# Patient Record
Sex: Male | Born: 1950
Health system: Southern US, Community
[De-identification: ages and names within clinical notes are randomized; demographics above are authoritative.]

## PROBLEM LIST (undated history)

## (undated) DIAGNOSIS — I10 Essential (primary) hypertension: Secondary | ICD-10-CM

---

## 2009-05-16 ENCOUNTER — Ambulatory Visit (HOSPITAL_COMMUNITY): Admission: RE | Admit: 2009-05-16 | Discharge: 2009-05-16 | Payer: Self-pay | Admitting: Orthopedic Surgery

## 2014-02-04 DIAGNOSIS — Z23 Encounter for immunization: Secondary | ICD-10-CM | POA: Diagnosis not present

## 2014-07-11 DIAGNOSIS — I129 Hypertensive chronic kidney disease with stage 1 through stage 4 chronic kidney disease, or unspecified chronic kidney disease: Secondary | ICD-10-CM | POA: Diagnosis not present

## 2014-07-11 DIAGNOSIS — E782 Mixed hyperlipidemia: Secondary | ICD-10-CM | POA: Diagnosis not present

## 2014-07-11 DIAGNOSIS — N183 Chronic kidney disease, stage 3 (moderate): Secondary | ICD-10-CM | POA: Diagnosis not present

## 2014-07-11 DIAGNOSIS — R7301 Impaired fasting glucose: Secondary | ICD-10-CM | POA: Diagnosis not present

## 2014-11-13 DIAGNOSIS — M1990 Unspecified osteoarthritis, unspecified site: Secondary | ICD-10-CM | POA: Diagnosis not present

## 2014-11-13 DIAGNOSIS — I129 Hypertensive chronic kidney disease with stage 1 through stage 4 chronic kidney disease, or unspecified chronic kidney disease: Secondary | ICD-10-CM | POA: Diagnosis not present

## 2014-11-13 DIAGNOSIS — Z23 Encounter for immunization: Secondary | ICD-10-CM | POA: Diagnosis not present

## 2014-11-13 DIAGNOSIS — M109 Gout, unspecified: Secondary | ICD-10-CM | POA: Diagnosis not present

## 2014-11-13 DIAGNOSIS — N183 Chronic kidney disease, stage 3 (moderate): Secondary | ICD-10-CM | POA: Diagnosis not present

## 2014-11-13 DIAGNOSIS — N400 Enlarged prostate without lower urinary tract symptoms: Secondary | ICD-10-CM | POA: Diagnosis not present

## 2014-11-13 DIAGNOSIS — N529 Male erectile dysfunction, unspecified: Secondary | ICD-10-CM | POA: Diagnosis not present

## 2014-11-13 DIAGNOSIS — Z125 Encounter for screening for malignant neoplasm of prostate: Secondary | ICD-10-CM | POA: Diagnosis not present

## 2014-11-13 DIAGNOSIS — Z0000 Encounter for general adult medical examination without abnormal findings: Secondary | ICD-10-CM | POA: Diagnosis not present

## 2014-11-13 DIAGNOSIS — R7301 Impaired fasting glucose: Secondary | ICD-10-CM | POA: Diagnosis not present

## 2014-11-13 DIAGNOSIS — Z1211 Encounter for screening for malignant neoplasm of colon: Secondary | ICD-10-CM | POA: Diagnosis not present

## 2014-11-13 DIAGNOSIS — E782 Mixed hyperlipidemia: Secondary | ICD-10-CM | POA: Diagnosis not present

## 2015-06-13 DIAGNOSIS — R7301 Impaired fasting glucose: Secondary | ICD-10-CM | POA: Diagnosis not present

## 2015-06-13 DIAGNOSIS — E782 Mixed hyperlipidemia: Secondary | ICD-10-CM | POA: Diagnosis not present

## 2015-06-13 DIAGNOSIS — N183 Chronic kidney disease, stage 3 (moderate): Secondary | ICD-10-CM | POA: Diagnosis not present

## 2015-06-13 DIAGNOSIS — I129 Hypertensive chronic kidney disease with stage 1 through stage 4 chronic kidney disease, or unspecified chronic kidney disease: Secondary | ICD-10-CM | POA: Diagnosis not present

## 2016-01-20 DIAGNOSIS — Z1211 Encounter for screening for malignant neoplasm of colon: Secondary | ICD-10-CM | POA: Diagnosis not present

## 2016-01-20 DIAGNOSIS — Z0000 Encounter for general adult medical examination without abnormal findings: Secondary | ICD-10-CM | POA: Diagnosis not present

## 2016-01-20 DIAGNOSIS — R7301 Impaired fasting glucose: Secondary | ICD-10-CM | POA: Diagnosis not present

## 2016-01-20 DIAGNOSIS — E782 Mixed hyperlipidemia: Secondary | ICD-10-CM | POA: Diagnosis not present

## 2016-01-20 DIAGNOSIS — M1990 Unspecified osteoarthritis, unspecified site: Secondary | ICD-10-CM | POA: Diagnosis not present

## 2016-01-20 DIAGNOSIS — N529 Male erectile dysfunction, unspecified: Secondary | ICD-10-CM | POA: Diagnosis not present

## 2016-01-20 DIAGNOSIS — I129 Hypertensive chronic kidney disease with stage 1 through stage 4 chronic kidney disease, or unspecified chronic kidney disease: Secondary | ICD-10-CM | POA: Diagnosis not present

## 2016-01-20 DIAGNOSIS — Z125 Encounter for screening for malignant neoplasm of prostate: Secondary | ICD-10-CM | POA: Diagnosis not present

## 2016-01-20 DIAGNOSIS — M109 Gout, unspecified: Secondary | ICD-10-CM | POA: Diagnosis not present

## 2016-01-20 DIAGNOSIS — Z23 Encounter for immunization: Secondary | ICD-10-CM | POA: Diagnosis not present

## 2016-01-20 DIAGNOSIS — N400 Enlarged prostate without lower urinary tract symptoms: Secondary | ICD-10-CM | POA: Diagnosis not present

## 2016-01-20 DIAGNOSIS — N183 Chronic kidney disease, stage 3 (moderate): Secondary | ICD-10-CM | POA: Diagnosis not present

## 2016-07-21 DIAGNOSIS — N183 Chronic kidney disease, stage 3 (moderate): Secondary | ICD-10-CM | POA: Diagnosis not present

## 2016-07-21 DIAGNOSIS — I129 Hypertensive chronic kidney disease with stage 1 through stage 4 chronic kidney disease, or unspecified chronic kidney disease: Secondary | ICD-10-CM | POA: Diagnosis not present

## 2016-07-21 DIAGNOSIS — R7301 Impaired fasting glucose: Secondary | ICD-10-CM | POA: Diagnosis not present

## 2016-07-21 DIAGNOSIS — E782 Mixed hyperlipidemia: Secondary | ICD-10-CM | POA: Diagnosis not present

## 2016-07-21 DIAGNOSIS — M1990 Unspecified osteoarthritis, unspecified site: Secondary | ICD-10-CM | POA: Diagnosis not present

## 2017-01-04 ENCOUNTER — Inpatient Hospital Stay (HOSPITAL_COMMUNITY)
Admission: EM | Admit: 2017-01-04 | Discharge: 2017-01-07 | DRG: 183 | Disposition: A | Discharge status: Home / Self Care | Payer: Medicare Other | Attending: General Surgery | Admitting: General Surgery

## 2017-01-04 ENCOUNTER — Emergency Department (HOSPITAL_COMMUNITY): Payer: Medicare Other

## 2017-01-04 ENCOUNTER — Encounter (HOSPITAL_COMMUNITY): Payer: Self-pay | Admitting: Emergency Medicine

## 2017-01-04 DIAGNOSIS — I10 Essential (primary) hypertension: Secondary | ICD-10-CM | POA: Diagnosis present

## 2017-01-04 DIAGNOSIS — S271XXA Traumatic hemothorax, initial encounter: Secondary | ICD-10-CM | POA: Diagnosis present

## 2017-01-04 DIAGNOSIS — J942 Hemothorax: Secondary | ICD-10-CM

## 2017-01-04 DIAGNOSIS — S2242XA Multiple fractures of ribs, left side, initial encounter for closed fracture: Principal | ICD-10-CM | POA: Diagnosis present

## 2017-01-04 DIAGNOSIS — R9431 Abnormal electrocardiogram [ECG] [EKG]: Secondary | ICD-10-CM | POA: Diagnosis not present

## 2017-01-04 DIAGNOSIS — R079 Chest pain, unspecified: Secondary | ICD-10-CM | POA: Diagnosis not present

## 2017-01-04 DIAGNOSIS — W208XXA Other cause of strike by thrown, projected or falling object, initial encounter: Secondary | ICD-10-CM | POA: Diagnosis not present

## 2017-01-04 DIAGNOSIS — J939 Pneumothorax, unspecified: Secondary | ICD-10-CM | POA: Diagnosis not present

## 2017-01-04 DIAGNOSIS — N179 Acute kidney failure, unspecified: Secondary | ICD-10-CM | POA: Diagnosis present

## 2017-01-04 DIAGNOSIS — K7689 Other specified diseases of liver: Secondary | ICD-10-CM | POA: Diagnosis present

## 2017-01-04 DIAGNOSIS — S42112A Displaced fracture of body of scapula, left shoulder, initial encounter for closed fracture: Secondary | ICD-10-CM | POA: Diagnosis not present

## 2017-01-04 DIAGNOSIS — E041 Nontoxic single thyroid nodule: Secondary | ICD-10-CM | POA: Diagnosis present

## 2017-01-04 DIAGNOSIS — M549 Dorsalgia, unspecified: Secondary | ICD-10-CM | POA: Diagnosis not present

## 2017-01-04 DIAGNOSIS — S42102A Fracture of unspecified part of scapula, left shoulder, initial encounter for closed fracture: Secondary | ICD-10-CM | POA: Diagnosis present

## 2017-01-04 DIAGNOSIS — S27321A Contusion of lung, unilateral, initial encounter: Secondary | ICD-10-CM | POA: Diagnosis present

## 2017-01-04 DIAGNOSIS — J90 Pleural effusion, not elsewhere classified: Secondary | ICD-10-CM | POA: Diagnosis not present

## 2017-01-04 DIAGNOSIS — S0990XA Unspecified injury of head, initial encounter: Secondary | ICD-10-CM | POA: Diagnosis not present

## 2017-01-04 DIAGNOSIS — Z23 Encounter for immunization: Secondary | ICD-10-CM

## 2017-01-04 DIAGNOSIS — Y93H2 Activity, gardening and landscaping: Secondary | ICD-10-CM | POA: Diagnosis not present

## 2017-01-04 DIAGNOSIS — D62 Acute posthemorrhagic anemia: Secondary | ICD-10-CM | POA: Diagnosis present

## 2017-01-04 DIAGNOSIS — M25512 Pain in left shoulder: Secondary | ICD-10-CM | POA: Diagnosis not present

## 2017-01-04 DIAGNOSIS — S3991XA Unspecified injury of abdomen, initial encounter: Secondary | ICD-10-CM | POA: Diagnosis not present

## 2017-01-04 DIAGNOSIS — S199XXA Unspecified injury of neck, initial encounter: Secondary | ICD-10-CM | POA: Diagnosis not present

## 2017-01-04 DIAGNOSIS — T148XXA Other injury of unspecified body region, initial encounter: Secondary | ICD-10-CM | POA: Diagnosis not present

## 2017-01-04 HISTORY — DX: Essential (primary) hypertension: I10

## 2017-01-04 LAB — COMPREHENSIVE METABOLIC PANEL
ALK PHOS: 37 U/L — AB (ref 38–126)
ALT: 43 U/L (ref 17–63)
AST: 51 U/L — ABNORMAL HIGH (ref 15–41)
Albumin: 3.7 g/dL (ref 3.5–5.0)
Anion gap: 10 (ref 5–15)
BUN: 34 mg/dL — ABNORMAL HIGH (ref 6–20)
CALCIUM: 8.9 mg/dL (ref 8.9–10.3)
CO2: 23 mmol/L (ref 22–32)
CREATININE: 1.95 mg/dL — AB (ref 0.61–1.24)
Chloride: 105 mmol/L (ref 101–111)
GFR calc non Af Amer: 34 mL/min — ABNORMAL LOW (ref >60)
GFR, EST AFRICAN AMERICAN: 39 mL/min — AB (ref >60)
Glucose, Bld: 153 mg/dL — ABNORMAL HIGH (ref 65–99)
Potassium: 4 mmol/L (ref 3.5–5.1)
SODIUM: 138 mmol/L (ref 135–145)
Total Bilirubin: 0.7 mg/dL (ref 0.3–1.2)
Total Protein: 6.1 g/dL — ABNORMAL LOW (ref 6.5–8.1)

## 2017-01-04 LAB — CBC
HEMATOCRIT: 41.7 % (ref 39.0–52.0)
HEMOGLOBIN: 14 g/dL (ref 13.0–17.0)
MCH: 29.3 pg (ref 26.0–34.0)
MCHC: 33.6 g/dL (ref 30.0–36.0)
MCV: 87.2 fL (ref 78.0–100.0)
Platelets: 277 10*3/uL (ref 150–400)
RBC: 4.78 MIL/uL (ref 4.22–5.81)
RDW: 13.1 % (ref 11.5–15.5)
WBC: 15.3 10*3/uL — ABNORMAL HIGH (ref 4.0–10.5)

## 2017-01-04 LAB — URINALYSIS, ROUTINE W REFLEX MICROSCOPIC
BILIRUBIN URINE: NEGATIVE
Glucose, UA: NEGATIVE mg/dL
KETONES UR: NEGATIVE mg/dL
Leukocytes, UA: NEGATIVE
Nitrite: NEGATIVE
PROTEIN: 100 mg/dL — AB
SQUAMOUS EPITHELIAL / LPF: NONE SEEN
Specific Gravity, Urine: 1.04 — ABNORMAL HIGH (ref 1.005–1.030)
pH: 5 (ref 5.0–8.0)

## 2017-01-04 LAB — I-STAT CHEM 8, ED
BUN: 35 mg/dL — AB (ref 6–20)
CALCIUM ION: 1.13 mmol/L — AB (ref 1.15–1.40)
CHLORIDE: 104 mmol/L (ref 101–111)
Creatinine, Ser: 1.9 mg/dL — ABNORMAL HIGH (ref 0.61–1.24)
GLUCOSE: 158 mg/dL — AB (ref 65–99)
HCT: 41 % (ref 39.0–52.0)
Hemoglobin: 13.9 g/dL (ref 13.0–17.0)
Potassium: 4 mmol/L (ref 3.5–5.1)
Sodium: 140 mmol/L (ref 135–145)
TCO2: 23 mmol/L (ref 22–32)

## 2017-01-04 LAB — I-STAT CG4 LACTIC ACID, ED: LACTIC ACID, VENOUS: 2.59 mmol/L — AB (ref 0.5–1.9)

## 2017-01-04 LAB — ETHANOL: Alcohol, Ethyl (B): <10 mg/dL (ref <10)

## 2017-01-04 LAB — SAMPLE TO BLOOD BANK

## 2017-01-04 LAB — PROTIME-INR
INR: 1.01
Prothrombin Time: 13.2 seconds (ref 11.4–15.2)

## 2017-01-04 LAB — CDS SEROLOGY

## 2017-01-04 MED ORDER — MORPHINE SULFATE (PF) 4 MG/ML IV SOLN: 4.0000 mg | INTRAVENOUS | Status: DC | PRN

## 2017-01-04 MED ORDER — ACETAMINOPHEN 325 MG PO TABS: 650.0000 mg | ORAL_TABLET | ORAL | Status: DC | PRN

## 2017-01-04 MED ORDER — ALBUMIN HUMAN 5 % IV SOLN
12.5000 g | Freq: Once | INTRAVENOUS | Status: AC
  Administered 2017-01-04: 12.5 g via INTRAVENOUS
  Filled 2017-01-04: qty 250

## 2017-01-04 MED ORDER — FENTANYL CITRATE (PF) 100 MCG/2ML IJ SOLN
25.0000 ug | INTRAMUSCULAR | Status: DC | PRN
  Administered 2017-01-04 (×2): 25 ug via INTRAVENOUS
  Filled 2017-01-04 (×2): qty 2

## 2017-01-04 MED ORDER — KCL IN DEXTROSE-NACL 20-5-0.45 MEQ/L-%-% IV SOLN
INTRAVENOUS | Status: DC
  Administered 2017-01-04: 19:00:00 via INTRAVENOUS
  Filled 2017-01-04 (×3): qty 1000

## 2017-01-04 MED ORDER — OXYCODONE HCL 5 MG PO TABS
10.0000 mg | ORAL_TABLET | ORAL | Status: DC | PRN
  Administered 2017-01-04 – 2017-01-07 (×6): 10 mg via ORAL
  Filled 2017-01-04 (×7): qty 2

## 2017-01-04 MED ORDER — SODIUM CHLORIDE 0.9 % IV BOLUS (SEPSIS)
1000.0000 mL | Freq: Once | INTRAVENOUS | Status: AC
  Administered 2017-01-04: 1000 mL via INTRAVENOUS

## 2017-01-04 MED ORDER — METHOCARBAMOL 1000 MG/10ML IJ SOLN
1000.0000 mg | Freq: Three times a day (TID) | INTRAVENOUS | Status: DC | PRN
  Filled 2017-01-04: qty 10

## 2017-01-04 MED ORDER — IOPAMIDOL (ISOVUE-300) INJECTION 61%
INTRAVENOUS | Status: AC
  Administered 2017-01-04: 70 mL
  Filled 2017-01-04: qty 75

## 2017-01-04 MED ORDER — OXYCODONE HCL 5 MG PO TABS
5.0000 mg | ORAL_TABLET | ORAL | Status: DC | PRN
  Administered 2017-01-06 – 2017-01-07 (×3): 5 mg via ORAL
  Filled 2017-01-04 (×3): qty 1

## 2017-01-04 MED ORDER — IPRATROPIUM-ALBUTEROL 0.5-2.5 (3) MG/3ML IN SOLN
3.0000 mL | Freq: Four times a day (QID) | RESPIRATORY_TRACT | Status: DC
  Administered 2017-01-04 (×2): 3 mL via RESPIRATORY_TRACT
  Filled 2017-01-04 (×2): qty 3

## 2017-01-04 MED ORDER — TRAMADOL HCL 50 MG PO TABS
50.0000 mg | ORAL_TABLET | Freq: Four times a day (QID) | ORAL | Status: DC
  Administered 2017-01-04 – 2017-01-06 (×8): 50 mg via ORAL
  Filled 2017-01-04 (×8): qty 1

## 2017-01-04 MED ORDER — SODIUM CHLORIDE 0.9 % IV SOLN
Freq: Once | INTRAVENOUS | Status: AC
  Administered 2017-01-04: 14:00:00 via INTRAVENOUS

## 2017-01-04 NOTE — H&P (Signed)
[facility] Admission Note  Joshua Durham 1950-08-13  [id number].    Requesting MD: Vanita Panda, MD Chief Complaint/Reason for Consult: traumatic injury from tree limb  HPI:  Joshua Durham is a 66 y/o male with a PMH of HTN who presented to the ED via EMS as a non-trauma activation after a large tree limb fell on his back. The patient reports +LOC and is amnestic to the event but, according to a bystander, the patient was cutting down a very large, dead [place] when it fell and hit him on the left side of his back. The patient recalls the limb falling but does not remember where it hit him or moving from the scene of the accident. He was helped back into his house before EMS was called. He arrived to the ED with cc L upper back pain and SOB. He denies HA, CP, abdominal pain, or pain in extremities. He is a non-smoker. Denies history of diabetes or MI. Has no history of surgery. NKDA. Reports that his PCP is Dr. Serita Grammes. His wife reports that he may have had abnormal kidney function in the past.  ROS: Review of Systems  Constitutional: Negative for chills and fever.  Respiratory: Positive for shortness of breath. Negative for hemoptysis and sputum production.   Cardiovascular: Negative for chest pain.  Gastrointestinal: Negative for abdominal pain, nausea and vomiting.  Musculoskeletal: Positive for back pain.  Neurological: Positive for loss of consciousness.    No family history on file.  Past Medical History:  Diagnosis Date  . Hypertension     History reviewed. No pertinent surgical history.  Social History:  has no tobacco, alcohol, and drug history on file.  Allergies: No Known Allergies   (Not in a hospital admission)  Blood pressure (!) 94/57, pulse (!) 53, resp. rate 18, SpO2 97 %. Physical Exam: Physical Exam  Constitutional: He is oriented to person, place, and time. He appears well-developed and well-nourished. No distress.  HENT:  Head:  Normocephalic and atraumatic.  Right Ear: External ear normal.  Left Ear: External ear normal.  Eyes: Pupils are equal, round, and reactive to light. EOM are normal. Right eye exhibits no discharge. Left eye exhibits no discharge.  Neck: Normal range of motion. No tracheal deviation present. No thyromegaly present.  Cardiovascular: Normal rate, regular rhythm, normal heart sounds and intact distal pulses.  Exam reveals no friction rub.   No murmur heard. Pulmonary/Chest: Breath sounds normal. No stridor. No respiratory distress. He has no wheezes. He has no rales. He exhibits tenderness (appropriately tender over left posterior chest wall and upper back).  Mildly labored respirations   Abdominal: Soft. Bowel sounds are normal. He exhibits no distension and no mass. There is no tenderness. There is no rebound and no guarding.  Musculoskeletal: He exhibits tenderness.  Neurological: He is alert and oriented to person, place, and time. No sensory deficit.  Skin: Skin is warm and dry. No rash noted. He is not diaphoretic.  Psychiatric: He has a normal mood and affect. His behavior is normal.    Results for orders placed or performed during the hospital encounter of 01/04/17 (from the past 48 hour(s))  I-Stat Chem 8, ED     Status: Abnormal   Collection Time: 01/04/17 11:47 AM  Result Value Ref Range   Sodium 140 135 - 145 mmol/L   Potassium 4.0 3.5 - 5.1 mmol/L   Chloride 104 101 - 111 mmol/L   BUN 35 (H)  6 - 20 mg/dL   Creatinine, Ser 1.90 (H) 0.61 - 1.24 mg/dL   Glucose, Bld 158 (H) 65 - 99 mg/dL   Calcium, Ion 1.13 (L) 1.15 - 1.40 mmol/L   TCO2 23 22 - 32 mmol/L   Hemoglobin 13.9 13.0 - 17.0 g/dL   HCT 41.0 39.0 - 52.0 %  I-Stat CG4 Lactic Acid, ED     Status: Abnormal   Collection Time: 01/04/17 11:47 AM  Result Value Ref Range   Lactic Acid, Venous 2.59 (HH) 0.5 - 1.9 mmol/L   Comment NOTIFIED PHYSICIAN    Dg Chest Port 1 View  Result Date: 01/04/2017 CLINICAL DATA:  Hit in the  back by a tree limb EXAM: PORTABLE CHEST 1 VIEW COMPARISON:  None. FINDINGS: Low volume film. Right lung clear. Hazy opacity overlying the left lung may be related to lung contusion. No evidence for pneumothorax or substantial pleural effusion. The cardio pericardial silhouette is enlarged. Complex fracture noted left scapula. Telemetry leads overlie the chest. IMPRESSION: 1. Complex left scapular fracture. 2. Decreased volume left hemithorax with haziness over the left lung. Atelectasis or lung contusion could have this appearance. Electronically Signed   By: Misty Stanley M.D.   On: 01/04/2017 11:33   Assessment/Plan Patient struck by tree limb Multiple Left posterior rib FX - with involvement of CV junctions, TP, and articulated facets. pain control, IS/pulm toilet Left HTX - No PTX, repeat CXR in AM. Hold off on chest tube for now. CBC in AM Left pulmonary contusion with cavitation  Comminuted left scapula fracture - orthopedic surgery consulted, Dr. Lorin Mercy to evaluate Thoracic transverse process fractures, left  AKI - IVF, recheck BMET in AM Heterogenous left thyroid nodule - incidental finding on CT, outpatient US/workup at hospital discharge  FEN: IVF, will keep NPO until sure orthopedic surgery not taking patient to OR today. ID: None VTE: SCD's Dispo: [place], [facility] 01/04/2017, 12:41 PM Pager: [phone] Consults: [phone] Mon-Fri 7:00 am-4:30 pm Sat-Sun 7:00 am-11:30 am

## 2017-01-04 NOTE — Progress Notes (Signed)
Orthopedic Tech Progress Note Patient Details:  Joshua Durham January 30, 1951 [id number] Level 2 trauma ortho visit. Patient ID: Joshua Durham, male   DOB: 1950-04-16, 66 y.o.   MRN: [id number]   Braulio Bosch 01/04/2017, 1:03 PM

## 2017-01-04 NOTE — ED Provider Notes (Signed)
[place] DEPT Provider Note   CSN: [id number] Arrival date & time: 01/04/17  1105     History   Chief Complaint Chief Complaint  Patient presents with  . Head Injury    HPI Joshua Durham is a 66 y.o. male.  HPI Patient presents via EMS after being struck by a tree branch. The patient is a healthy elderly male was taking unaided tree, when a branch from the tree fell, striking him in his left superior thorax posteriorly. There was loss of consciousness, but upon awakening no confusion, no disorientation, no weakness in his extremities. However, he has had substantial pain throughout the left superior lateral thorax, and shoulder since the event. No medication taken for relief. EMS notes that the patient was initially hypotensive, but improved during transport after provision of IV fluids.   Following after arrival the patient's wife is also present. 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    Past Medical History:  Diagnosis Date  . Hypertension     There are no active problems to display for this patient.   History reviewed. No pertinent surgical history.     Home Medications    Prior to Admission medications   Not on File    Family History No family history on file.  Social History Social History  Substance Use Topics  . Smoking status: Not on file  . Smokeless tobacco: Not on file  . Alcohol use Not on file     Allergies   Patient has no known allergies.   Review of Systems Review of Systems  Constitutional:       Per HPI, otherwise negative  HENT:       Per HPI, otherwise negative  Respiratory:       Per HPI, otherwise negative  Cardiovascular:       Per HPI, otherwise negative  Gastrointestinal: Positive for nausea. Negative for vomiting.  Endocrine:       Negative aside from HPI  Genitourinary:       Neg aside from HPI   Musculoskeletal:       Per HPI, otherwise negative  Skin: Negative.     Neurological: Positive for syncope.     Physical Exam Updated Vital Signs There were no vitals taken for this visit.  Physical Exam  Constitutional: He is oriented to person, place, and time. He appears well-developed and well-nourished.  HENT:  Head: Normocephalic and atraumatic.  Eyes: Conjunctivae and EOM are normal.  Neck: Neck supple. No spinous process tenderness and no muscular tenderness present. No neck rigidity. No edema, no erythema and normal range of motion present.  Cardiovascular: Normal rate and regular rhythm.   Pulmonary/Chest: No stridor. Tachypnea noted. He has decreased breath sounds in the left upper field, the left middle field and the left lower field.  Abdominal: He exhibits no distension.  Musculoskeletal: He exhibits no edema.       Right shoulder: Normal.       Left elbow: Normal.       Left wrist: Normal.       Arms: Neurological: He is alert and oriented to person, place, and time. He displays no atrophy and no tremor. No cranial nerve deficit or sensory deficit. He displays no seizure activity.  Skin: Skin is warm and dry.  Psychiatric: He has a normal mood and affect.  Nursing note and vitals reviewed.    ED Treatments / Results  Labs (all labs ordered are listed, but only  abnormal results are displayed) Labs Reviewed  CBC - Abnormal; Notable for the following:       Result Value   WBC 15.3 (*)    All other components within normal limits  COMPREHENSIVE METABOLIC PANEL - Abnormal; Notable for the following:    Glucose, Bld 153 (*)    BUN 34 (*)    Creatinine, Ser 1.95 (*)    Total Protein 6.1 (*)    AST 51 (*)    Alkaline Phosphatase 37 (*)    GFR calc non Af Amer 34 (*)    GFR calc Af Amer 39 (*)    All other components within normal limits  I-STAT CHEM 8, ED - Abnormal; Notable for the following:    BUN 35 (*)    Creatinine, Ser 1.90 (*)    Glucose, Bld 158 (*)    Calcium, Ion 1.13 (*)    All other components within normal limits   I-STAT CG4 LACTIC ACID, ED - Abnormal; Notable for the following:    Lactic Acid, Venous 2.59 (*)    All other components within normal limits  CDS SEROLOGY  ETHANOL  PROTIME-INR  URINALYSIS, ROUTINE W REFLEX MICROSCOPIC  SAMPLE TO BLOOD BANK    EKG  EKG Interpretation  Date/Time:  Tuesday January 04 2017 11:12:26 EDT Ventricular Rate:  63 PR Interval:    QRS Duration: 115 QT Interval:  411 QTC Calculation: 421 R Axis:   6 Text Interpretation:  Sinus rhythm Incomplete right bundle branch block Artifact Abnormal ekg Confirmed by Carmin Muskrat [id number]) on 01/04/2017 11:16:53 AM       Radiology Ct Head Wo Contrast  Result Date: 01/04/2017 CLINICAL DATA:  Head trauma after fall. EXAM: CT HEAD WITHOUT CONTRAST CT CERVICAL SPINE WITHOUT CONTRAST TECHNIQUE: Multidetector CT imaging of the head and cervical spine was performed following the standard protocol without intravenous contrast. Multiplanar CT image reconstructions of the cervical spine were also generated. COMPARISON:  None. FINDINGS: CT HEAD FINDINGS Brain: No evidence of acute infarction, hemorrhage, hydrocephalus, extra-axial collection or mass lesion/mass effect. Vascular: Atherosclerotic vascular calcification of the carotid siphons. No hyperdense vessel. Skull: Normal. Negative for fracture or focal lesion. Sinuses/Orbits: Partial opacification of the left maxillary sinus with high-density, inspissated secretions, and thickening of the maxillary sinus walls, consistent with chronic sinusitis. Mild mucosal thickening in the bilateral frontal sinuses, right maxillary sinus, and left greater than right ethmoid air cells. The orbits are unremarkable. Other: None. CT CERVICAL SPINE FINDINGS Alignment: Reversal of the normal cervical lordosis. No traumatic malalignment. Skull base and vertebrae: No acute fracture. No primary bone lesion or focal pathologic process. Soft tissues and spinal canal: No prevertebral fluid or swelling. No  visible canal hematoma. Disc levels: Mild-to-moderate multilevel degenerative disc disease and facet uncovertebral hypertrophy throughout the cervical spine. Severe right neuroforaminal stenosis at C4-C5. Upper chest: Partially visualized left second and third posterior rib fractures. Please see separate CT chest report from same day for further details. Other: None. IMPRESSION: 1.  No acute intracranial abnormality. 2.  No acute cervical spine fracture. 3. Partially visualized left second and third posterior rib fractures. Please see separate CT chest report from same day for further details. Electronically Signed   By: Titus Dubin M.D.   On: 01/04/2017 13:36   Ct Chest W Contrast  Result Date: 01/04/2017 CLINICAL DATA:  Shoulder and chest pain after tree branch striking patient today. Initial encounter. EXAM: CT CHEST WITH CONTRAST TECHNIQUE: Multidetector CT imaging of the chest was performed  during intravenous contrast administration. CONTRAST:  11mL ISOVUE-300 IOPAMIDOL (ISOVUE-300) INJECTION 61% COMPARISON:  Portable chest same date. FINDINGS: Cardiovascular: No acute vascular findings. There is atherosclerosis of the aorta, great vessels and coronary arteries peer no abnormality of the pulmonary arteries identified. The heart size is normal. There is no pericardial effusion. Mediastinum/Nodes: There are no enlarged mediastinal, hilar or axillary lymph nodes.No evidence of mediastinal hematoma. There is an ill-defined left thyroid nodule, best seen on coronal image 40, measuring 2.6 cm. Small hiatal hernia. Lungs/Pleura: There is a small dependent left pleural effusion. No pneumothorax. There is perihilar airspace disease in the left upper lobe surrounding several air-fluid levels, likely pulmonary contusion with cavitation. There is mild dependent atelectasis in both lungs. No pneumomediastinum. Upper abdomen: No acute findings are seen within the visualized upper abdomen. There are low-density  lesions in the upper pole of the right kidney and in the left hepatic lobe which are too small to characterize. Musculoskeletal/Chest wall: As demonstrated on the chest radiographs, there is a comminuted fracture involving the left scapular body, spine and base of the coracoid and acromion. No intra-articular extension into the glenohumeral joint is demonstrated. The humerus is located. The left clavicle appears intact. There is moderate surrounding hemorrhage within the trapezius, deltoid and rotator cuff musculature. There are multiple nondisplaced rib fractures on the left involving the costovertebral junctions, extending from the second through the seventh ribs. There are also nondisplaced fractures of the adjacent transverse processes on the left. There is probable involvement of the left-sided articulating facets at T3, T4 and T5. No vertebral body fracture or intraspinal fracture fragments are seen. IMPRESSION: 1. Multifocal left upper lobe pulmonary contusion with cavitation. 2. Left hemothorax.  No pneumothorax or pneumomediastinum. 3. No evidence of mediastinal hematoma or great vessel injury. 4. Comminuted fracture of the left scapula, involving the body, spine, coracoid and acromion. Associated adjacent intramuscular hematomas. 5. Multiple nondisplaced fractures involving the costovertebral junctions, transverse processes and articulating facets in the upper left chest. 6. Heterogeneous left thyroid nodule. Consider further evaluation with nonemergent thyroid ultrasound. This follows ACR consensus guidelines: Managing Incidental Thyroid Nodules Detected on Imaging: White Paper of the ACR Incidental Thyroid Findings Committee. J Am Coll Radiol 2015; 12:143-150. Electronically Signed   By: Richardean Sale M.D.   On: 01/04/2017 13:49   Ct Cervical Spine Wo Contrast  Result Date: 01/04/2017 CLINICAL DATA:  Head trauma after fall. EXAM: CT HEAD WITHOUT CONTRAST CT CERVICAL SPINE WITHOUT CONTRAST  TECHNIQUE: Multidetector CT imaging of the head and cervical spine was performed following the standard protocol without intravenous contrast. Multiplanar CT image reconstructions of the cervical spine were also generated. COMPARISON:  None. FINDINGS: CT HEAD FINDINGS Brain: No evidence of acute infarction, hemorrhage, hydrocephalus, extra-axial collection or mass lesion/mass effect. Vascular: Atherosclerotic vascular calcification of the carotid siphons. No hyperdense vessel. Skull: Normal. Negative for fracture or focal lesion. Sinuses/Orbits: Partial opacification of the left maxillary sinus with high-density, inspissated secretions, and thickening of the maxillary sinus walls, consistent with chronic sinusitis. Mild mucosal thickening in the bilateral frontal sinuses, right maxillary sinus, and left greater than right ethmoid air cells. The orbits are unremarkable. Other: None. CT CERVICAL SPINE FINDINGS Alignment: Reversal of the normal cervical lordosis. No traumatic malalignment. Skull base and vertebrae: No acute fracture. No primary bone lesion or focal pathologic process. Soft tissues and spinal canal: No prevertebral fluid or swelling. No visible canal hematoma. Disc levels: Mild-to-moderate multilevel degenerative disc disease and facet uncovertebral hypertrophy throughout the cervical  spine. Severe right neuroforaminal stenosis at C4-C5. Upper chest: Partially visualized left second and third posterior rib fractures. Please see separate CT chest report from same day for further details. Other: None. IMPRESSION: 1.  No acute intracranial abnormality. 2.  No acute cervical spine fracture. 3. Partially visualized left second and third posterior rib fractures. Please see separate CT chest report from same day for further details. Electronically Signed   By: Titus Dubin M.D.   On: 01/04/2017 13:36   Dg Chest Port 1 View  Result Date: 01/04/2017 CLINICAL DATA:  Hit in the back by a tree limb EXAM:  PORTABLE CHEST 1 VIEW COMPARISON:  None. FINDINGS: Low volume film. Right lung clear. Hazy opacity overlying the left lung may be related to lung contusion. No evidence for pneumothorax or substantial pleural effusion. The cardio pericardial silhouette is enlarged. Complex fracture noted left scapula. Telemetry leads overlie the chest. IMPRESSION: 1. Complex left scapular fracture. 2. Decreased volume left hemithorax with haziness over the left lung. Atelectasis or lung contusion could have this appearance. Electronically Signed   By: Misty Stanley M.D.   On: 01/04/2017 11:33    Procedures Procedures (including critical care time)  Medications Ordered in ED Medications  fentaNYL (SUBLIMAZE) injection 25 mcg (25 mcg Intravenous Given 01/04/17 1356)  dextrose 5 % and 0.45 % NaCl with KCl 20 mEq/L infusion (not administered)  acetaminophen (TYLENOL) tablet 650 mg (not administered)  oxyCODONE (Oxy IR/ROXICODONE) immediate release tablet 5 mg (not administered)  oxyCODONE (Oxy IR/ROXICODONE) immediate release tablet 10 mg (not administered)  morphine 4 MG/ML injection 4 mg (not administered)  traMADol (ULTRAM) tablet 50 mg (not administered)  methocarbamol (ROBAXIN) 1,000 mg in dextrose 5 % 50 mL IVPB (not administered)  albumin human 5 % solution 12.5 g (not administered)  ipratropium-albuterol (DUONEB) 0.5-2.5 (3) MG/3ML nebulizer solution 3 mL (not administered)  sodium chloride 0.9 % bolus 1,000 mL (0 mLs Intravenous Stopped 01/04/17 1439)  iopamidol (ISOVUE-300) 61 % injection (70 mLs  Contrast Given 01/04/17 1215)     Initial Impression / Assessment and Plan / ED Course  I have reviewed the triage vital signs and the nursing notes.  Pertinent labs & imaging results that were available during my care of the patient were reviewed by me and considered in my medical decision making (see chart for details).  After the initial chest x-ray was performed, with concern for pulmonary contusion, and  fracture, given the mechanism, patient was cleared for CT scan head, neck, thorax per Patient's initial borderline blood pressure reading decreased, and the patient was designated a level II trauma. Patient then received IV fluids, with slight improvement in his blood pressure value.  I discussed patient's case with her trauma surgery team, and given concern for hemothorax as well as complex scapula fracture, and costovertebral fractures.  Patient is being prepared for a chest tube Throughout the patient's resuscitation family members are aware of his condition.   Final Clinical Impressions(s) / ED Diagnoses  Trauma Hemothorax Rib fractures multiple Scapular fracture  CRITICAL CARE Performed by: Carmin Muskrat Total critical care time: 40 minutes Critical care time was exclusive of separately billable procedures and treating other patients. Critical care was necessary to treat or prevent imminent or life-threatening deterioration. Critical care was time spent personally by me on the following activities: development of treatment plan with patient and/or surrogate as well as nursing, discussions with consultants, evaluation of patient's response to treatment, examination of patient, obtaining history from patient or surrogate,  ordering and performing treatments and interventions, ordering and review of laboratory studies, ordering and review of radiographic studies, pulse oximetry and re-evaluation of patient's condition.    Carmin Muskrat, MD 01/04/17 [phone]

## 2017-01-04 NOTE — ED Notes (Signed)
ED Provider at bedside. 

## 2017-01-04 NOTE — Progress Notes (Signed)
Orthopedic Tech Progress Note Patient Details:  HUIE GHUMAN [birth year] [id number]  Ortho Devices Type of Ortho Device: Shoulder immobilizer Ortho Device/Splint Location: LUE Ortho Device/Splint Interventions: Ordered, Application   Braulio Bosch 01/04/2017, 3:24 PM

## 2017-01-04 NOTE — ED Notes (Signed)
DR. Vanita Panda made aware of patients current bp of 73/41, advised fluids placed in pressure bag, pt remains a/ox4, mentation WNL, resp e/u, nad.

## 2017-01-04 NOTE — ED Triage Notes (Addendum)
Pt arrives via [facility], pt was cutting tree limbs when a limb approx 5-6 inch in diameter fell 20 feet striking pt on his back. +LOC, ecchymosis present to left scapula. Pt was diaphoretic upon ems arrival, no crepitus or deformity noted per ems, initial bp 70/40, improved to 106/54 after 300 cc ns. Pt a/ox4, no recollection of incident but remembers prior to. resp e/u, nad.

## 2017-01-04 NOTE — ED Notes (Signed)
phlebotomy made aware of need for remaining labs to be drawn

## 2017-01-04 NOTE — Progress Notes (Signed)
Patient arrived the unit from ER, assessment completed see flowsheet, placed on tele ccmd notified, patient oriented to room and staff, bed in lowest position call bell within reach will continue to monitor.

## 2017-01-04 NOTE — Progress Notes (Signed)
   01/04/17 1200  Clinical Encounter Type  Visited With Patient and family together  Visit Type Trauma  Referral From Nurse  Consult/Referral To Chaplain  Spiritual Encounters  Spiritual Needs Emotional   Patient had been here in ED but was moved to Level II.  I responded to the page.  Spouse was present with the patient.  Following the exam I met the spouse and she was in a positive emotional state and patient had stabilized.  Offered emotional support.  Will follows needed.  Spouse was appreciative of the chaplain checking on them. Chaplain Katherene Ponto

## 2017-01-04 NOTE — ED Notes (Signed)
Ortho paged for shoulder immobilizer

## 2017-01-04 NOTE — ED Notes (Signed)
Leanord Hawking, RN spoke with Helene Kelp on 4E and made her aware that patient was en route and staff would send albumin and fluids up with patient as it was not able to be started in ED.

## 2017-01-04 NOTE — Consult Note (Addendum)
Reason for Consult:Scap fx Referring Physician: R Ronrico Durham is an 66 y.o. male.  HPI: Joshua Durham was cutting down a dead tree when a branch came down and struck him in the left back. He had immediate pain and came to the ED for evaluation. He was not a trauma activation. X-rays showed a left scapula fx in addition to other injuries and orthopedic surgery was consulted. Trauma service is admitting pt.  Past Medical History:  Diagnosis Date  . Hypertension     History reviewed. No pertinent surgical history.  No family history on file.  Social History:  has no tobacco, alcohol, and drug history on file.  Allergies: No Known Allergies  Medications: I have reviewed the patient's current medications.  Results for orders placed or performed during the hospital encounter of 01/04/17 (from the past 48 hour(s))  I-Stat Chem 8, ED     Status: Abnormal   Collection Time: 01/04/17 11:47 AM  Result Value Ref Range   Sodium 140 135 - 145 mmol/L   Potassium 4.0 3.5 - 5.1 mmol/L   Chloride 104 101 - 111 mmol/L   BUN 35 (H) 6 - 20 mg/dL   Creatinine, Ser 1.90 (H) 0.61 - 1.24 mg/dL   Glucose, Bld 158 (H) 65 - 99 mg/dL   Calcium, Ion 1.13 (L) 1.15 - 1.40 mmol/L   TCO2 23 22 - 32 mmol/L   Hemoglobin 13.9 13.0 - 17.0 g/dL   HCT 41.0 39.0 - 52.0 %  I-Stat CG4 Lactic Acid, ED     Status: Abnormal   Collection Time: 01/04/17 11:47 AM  Result Value Ref Range   Lactic Acid, Venous 2.59 (HH) 0.5 - 1.9 mmol/L   Comment NOTIFIED PHYSICIAN   Ethanol     Status: None   Collection Time: 01/04/17 12:42 PM  Result Value Ref Range   Alcohol, Ethyl (B) <10 <10 mg/dL    Comment:        LOWEST DETECTABLE LIMIT FOR SERUM ALCOHOL IS 10 mg/dL FOR MEDICAL PURPOSES ONLY Please note change in reference range.   CBC     Status: Abnormal   Collection Time: 01/04/17 12:47 PM  Result Value Ref Range   WBC 15.3 (H) 4.0 - 10.5 K/uL   RBC 4.78 4.22 - 5.81 MIL/uL   Hemoglobin 14.0 13.0 - 17.0 g/dL    HCT 41.7 39.0 - 52.0 %   MCV 87.2 78.0 - 100.0 fL   MCH 29.3 26.0 - 34.0 pg   MCHC 33.6 30.0 - 36.0 g/dL   RDW 13.1 11.5 - 15.5 %   Platelets 277 150 - 400 K/uL  Comprehensive metabolic panel     Status: Abnormal   Collection Time: 01/04/17 12:47 PM  Result Value Ref Range   Sodium 138 135 - 145 mmol/L   Potassium 4.0 3.5 - 5.1 mmol/L   Chloride 105 101 - 111 mmol/L   CO2 23 22 - 32 mmol/L   Glucose, Bld 153 (H) 65 - 99 mg/dL   BUN 34 (H) 6 - 20 mg/dL   Creatinine, Ser 1.95 (H) 0.61 - 1.24 mg/dL   Calcium 8.9 8.9 - 10.3 mg/dL   Total Protein 6.1 (L) 6.5 - 8.1 g/dL   Albumin 3.7 3.5 - 5.0 g/dL   AST 51 (H) 15 - 41 U/L   ALT 43 17 - 63 U/L   Alkaline Phosphatase 37 (L) 38 - 126 U/L   Total Bilirubin 0.7 0.3 - 1.2 mg/dL   GFR calc  non Af Amer 34 (L) >60 mL/min   GFR calc Af Amer 39 (L) >60 mL/min    Comment: (NOTE) The eGFR has been calculated using the CKD EPI equation. This calculation has not been validated in all clinical situations. eGFR's persistently <60 mL/min signify possible Chronic Kidney Disease.    Anion gap 10 5 - 15  CDS serology     Status: None   Collection Time: 01/04/17 12:50 PM  Result Value Ref Range   CDS serology specimen STAT   Protime-INR     Status: None   Collection Time: 01/04/17 12:50 PM  Result Value Ref Range   Prothrombin Time 13.2 11.4 - 15.2 seconds   INR 1.01   Sample to Blood Bank     Status: None   Collection Time: 01/04/17 12:50 PM  Result Value Ref Range   Blood Bank Specimen SAMPLE AVAILABLE FOR TESTING    Sample Expiration 01/05/2017     Ct Head Wo Contrast  Result Date: 01/04/2017 CLINICAL DATA:  Head trauma after fall. EXAM: CT HEAD WITHOUT CONTRAST CT CERVICAL SPINE WITHOUT CONTRAST TECHNIQUE: Multidetector CT imaging of the head and cervical spine was performed following the standard protocol without intravenous contrast. Multiplanar CT image reconstructions of the cervical spine were also generated. COMPARISON:  None.  FINDINGS: CT HEAD FINDINGS Brain: No evidence of acute infarction, hemorrhage, hydrocephalus, extra-axial collection or mass lesion/mass effect. Vascular: Atherosclerotic vascular calcification of the carotid siphons. No hyperdense vessel. Skull: Normal. Negative for fracture or focal lesion. Sinuses/Orbits: Partial opacification of the left maxillary sinus with high-density, inspissated secretions, and thickening of the maxillary sinus walls, consistent with chronic sinusitis. Mild mucosal thickening in the bilateral frontal sinuses, right maxillary sinus, and left greater than right ethmoid air cells. The orbits are unremarkable. Other: None. CT CERVICAL SPINE FINDINGS Alignment: Reversal of the normal cervical lordosis. No traumatic malalignment. Skull base and vertebrae: No acute fracture. No primary bone lesion or focal pathologic process. Soft tissues and spinal canal: No prevertebral fluid or swelling. No visible canal hematoma. Disc levels: Mild-to-moderate multilevel degenerative disc disease and facet uncovertebral hypertrophy throughout the cervical spine. Severe right neuroforaminal stenosis at C4-C5. Upper chest: Partially visualized left second and third posterior rib fractures. Please see separate CT chest report from same day for further details. Other: None. IMPRESSION: 1.  No acute intracranial abnormality. 2.  No acute cervical spine fracture. 3. Partially visualized left second and third posterior rib fractures. Please see separate CT chest report from same day for further details. Electronically Signed   By: Joshua Durham M.D.   On: 01/04/2017 13:36   Ct Chest W Contrast  Result Date: 01/04/2017 CLINICAL DATA:  Shoulder and chest pain after tree branch striking patient today. Initial encounter. EXAM: CT CHEST WITH CONTRAST TECHNIQUE: Multidetector CT imaging of the chest was performed during intravenous contrast administration. CONTRAST:  98mL ISOVUE-300 IOPAMIDOL (ISOVUE-300) INJECTION  61% COMPARISON:  Portable chest same date. FINDINGS: Cardiovascular: No acute vascular findings. There is atherosclerosis of the aorta, great vessels and coronary arteries peer no abnormality of the pulmonary arteries identified. The heart size is normal. There is no pericardial effusion. Mediastinum/Nodes: There are no enlarged mediastinal, hilar or axillary lymph nodes.No evidence of mediastinal hematoma. There is an ill-defined left thyroid nodule, best seen on coronal image 40, measuring 2.6 cm. Small hiatal hernia. Lungs/Pleura: There is a small dependent left pleural effusion. No pneumothorax. There is perihilar airspace disease in the left upper lobe surrounding several air-fluid levels, likely  pulmonary contusion with cavitation. There is mild dependent atelectasis in both lungs. No pneumomediastinum. Upper abdomen: No acute findings are seen within the visualized upper abdomen. There are low-density lesions in the upper pole of the right kidney and in the left hepatic lobe which are too small to characterize. Musculoskeletal/Chest wall: As demonstrated on the chest radiographs, there is a comminuted fracture involving the left scapular body, spine and base of the coracoid and acromion. No intra-articular extension into the glenohumeral joint is demonstrated. The humerus is located. The left clavicle appears intact. There is moderate surrounding hemorrhage within the trapezius, deltoid and rotator cuff musculature. There are multiple nondisplaced rib fractures on the left involving the costovertebral junctions, extending from the second through the seventh ribs. There are also nondisplaced fractures of the adjacent transverse processes on the left. There is probable involvement of the left-sided articulating facets at T3, T4 and T5. No vertebral body fracture or intraspinal fracture fragments are seen. IMPRESSION: 1. Multifocal left upper lobe pulmonary contusion with cavitation. 2. Left hemothorax.  No  pneumothorax or pneumomediastinum. 3. No evidence of mediastinal hematoma or great vessel injury. 4. Comminuted fracture of the left scapula, involving the body, spine, coracoid and acromion. Associated adjacent intramuscular hematomas. 5. Multiple nondisplaced fractures involving the costovertebral junctions, transverse processes and articulating facets in the upper left chest. 6. Heterogeneous left thyroid nodule. Consider further evaluation with nonemergent thyroid ultrasound. This follows ACR consensus guidelines: Managing Incidental Thyroid Nodules Detected on Imaging: White Paper of the ACR Incidental Thyroid Findings Committee. J Am Coll Radiol 2015; 12:143-150. Electronically Signed   By: Richardean Sale M.D.   On: 01/04/2017 13:49   Ct Cervical Spine Wo Contrast  Result Date: 01/04/2017 CLINICAL DATA:  Head trauma after fall. EXAM: CT HEAD WITHOUT CONTRAST CT CERVICAL SPINE WITHOUT CONTRAST TECHNIQUE: Multidetector CT imaging of the head and cervical spine was performed following the standard protocol without intravenous contrast. Multiplanar CT image reconstructions of the cervical spine were also generated. COMPARISON:  None. FINDINGS: CT HEAD FINDINGS Brain: No evidence of acute infarction, hemorrhage, hydrocephalus, extra-axial collection or mass lesion/mass effect. Vascular: Atherosclerotic vascular calcification of the carotid siphons. No hyperdense vessel. Skull: Normal. Negative for fracture or focal lesion. Sinuses/Orbits: Partial opacification of the left maxillary sinus with high-density, inspissated secretions, and thickening of the maxillary sinus walls, consistent with chronic sinusitis. Mild mucosal thickening in the bilateral frontal sinuses, right maxillary sinus, and left greater than right ethmoid air cells. The orbits are unremarkable. Other: None. CT CERVICAL SPINE FINDINGS Alignment: Reversal of the normal cervical lordosis. No traumatic malalignment. Skull base and vertebrae: No  acute fracture. No primary bone lesion or focal pathologic process. Soft tissues and spinal canal: No prevertebral fluid or swelling. No visible canal hematoma. Disc levels: Mild-to-moderate multilevel degenerative disc disease and facet uncovertebral hypertrophy throughout the cervical spine. Severe right neuroforaminal stenosis at C4-C5. Upper chest: Partially visualized left second and third posterior rib fractures. Please see separate CT chest report from same day for further details. Other: None. IMPRESSION: 1.  No acute intracranial abnormality. 2.  No acute cervical spine fracture. 3. Partially visualized left second and third posterior rib fractures. Please see separate CT chest report from same day for further details. Electronically Signed   By: Titus Dubin M.D.   On: 01/04/2017 13:36   Dg Chest Port 1 View  Result Date: 01/04/2017 CLINICAL DATA:  Hit in the back by a tree limb EXAM: PORTABLE CHEST 1 VIEW COMPARISON:  None. FINDINGS:  Low volume film. Right lung clear. Hazy opacity overlying the left lung may be related to lung contusion. No evidence for pneumothorax or substantial pleural effusion. The cardio pericardial silhouette is enlarged. Complex fracture noted left scapula. Telemetry leads overlie the chest. IMPRESSION: 1. Complex left scapular fracture. 2. Decreased volume left hemithorax with haziness over the left lung. Atelectasis or lung contusion could have this appearance. Electronically Signed   By: Misty Stanley M.D.   On: 01/04/2017 11:33    Review of Systems  Constitutional: Negative for weight loss.  HENT: Negative for ear discharge, ear pain, hearing loss and tinnitus.   Eyes: Negative for blurred vision, double vision, photophobia and pain.  Respiratory: Negative for cough, sputum production and shortness of breath.   Cardiovascular: Positive for chest pain.  Gastrointestinal: Negative for abdominal pain, nausea and vomiting.  Genitourinary: Negative for dysuria,  flank pain, frequency and urgency.  Musculoskeletal: Positive for joint pain (Left shoulder). Negative for back pain, falls, myalgias and neck pain.  Neurological: Negative for dizziness, tingling, sensory change, focal weakness, loss of consciousness and headaches.  Endo/Heme/Allergies: Does not bruise/bleed easily.  Psychiatric/Behavioral: Negative for depression, memory loss and substance abuse. The patient is not nervous/anxious.    Blood pressure 103/64, pulse 65, resp. rate (!) 22, SpO2 100 %. Physical Exam  Constitutional: He appears well-developed and well-nourished. No distress.  HENT:  Head: Normocephalic.  Eyes: Conjunctivae are normal. Right eye exhibits no discharge. Left eye exhibits no discharge. No scleral icterus.  Cardiovascular: Normal rate and regular rhythm.   Respiratory: Effort normal. No respiratory distress.  Musculoskeletal:  Left shoulder, elbow, wrist, digits- no skin wounds, TTP posterior shoulder, no blocks to motion  Sens  Ax/Joshua/M/U intact  Mot   Ax/ Joshua/ PIN/ M/ AIN/ U intact  Rad 2+   Neurological: He is alert.  Skin: Skin is warm and dry. He is not diaphoretic.  Psychiatric: He has a normal mood and affect. His behavior is normal.    Assessment/Plan: Comminuted left scapula fx -- Sling, NWB. Anticipate non-operative treatment. Dr. Lorin Mercy to evaluate in AM.   Patient seen and examined. He has a closed scapular fracture is neurologically intact the left upper extremity. Plan is close treatment of the extra-articular scapular fracture. He can follow-up with me in the office in 2 weeks. We discussed he will need some physical therapy once he has some scapular healing. He will followup with me in 2 wks. Exam, images, labs reviewed and discussed with patient/  Lisette Abu, PA-C Orthopedic Surgery [phone] 01/04/2017, 2:22 PM

## 2017-01-04 NOTE — Progress Notes (Signed)
Orthopedic Tech Progress Note Patient Details:  Joshua Durham 1950/07/17 [id number] Removed second ortho charge. Patient ID: Joshua Durham, male   DOB: [birth year], 66 y.o.   MRN: [id number]   Braulio Bosch 01/04/2017, 3:31 PM

## 2017-01-04 NOTE — ED Notes (Signed)
Pt transported to CT with this RN 

## 2017-01-04 NOTE — Procedures (Signed)
FAST  Pre-procedure diagnosis:tree limb struck his back Post-procedure diagnosis:no significant free fluid in abdomen, poor view of pericardium Procedure: FAST Surgeon: Georganna Skeans, MD Procedure in detail: The patient's abdomen was imaged in 4 regions with the ultrasound. First, the right upper quadrant was imaged. No free fluid was seen between the right kidney and the liver in Morison's pouch. Next, the epigastrium was imaged. Poor view. Next, the left upper quadrant was imaged. No free fluid was seen between the left kidney and the spleen. Finally, the bladder was imaged. No free fluid was seen next to the bladder in the pelvis. Impression: Negative, poor epigastric view.  CT pending          Georganna Skeans, MD, MPH, FACS Trauma: [phone] General Surgery: [phone]

## 2017-01-05 ENCOUNTER — Inpatient Hospital Stay (HOSPITAL_COMMUNITY): Payer: Medicare Other

## 2017-01-05 LAB — CBC
HCT: 31 % — ABNORMAL LOW (ref 39.0–52.0)
HCT: 28 % — ABNORMAL LOW (ref 39.0–52.0)
Hemoglobin: 10.7 g/dL — ABNORMAL LOW (ref 13.0–17.0)
Hemoglobin: 8.7 g/dL — ABNORMAL LOW (ref 13.0–17.0)
MCH: 30.1 pg (ref 26.0–34.0)
MCH: 29.1 pg (ref 26.0–34.0)
MCHC: 34.5 g/dL (ref 30.0–36.0)
MCHC: 31.1 g/dL (ref 30.0–36.0)
MCV: 87.3 fL (ref 78.0–100.0)
MCV: 93.6 fL (ref 78.0–100.0)
Platelets: 200 10*3/uL (ref 150–400)
Platelets: 175 10*3/uL (ref 150–400)
RBC: 3.55 MIL/uL — ABNORMAL LOW (ref 4.22–5.81)
RBC: 2.99 MIL/uL — AB (ref 4.22–5.81)
RDW: 13.7 % (ref 11.5–15.5)
RDW: 14.4 % (ref 11.5–15.5)
WBC: 7.2 10*3/uL (ref 4.0–10.5)
WBC: 5.1 10*3/uL (ref 4.0–10.5)

## 2017-01-05 LAB — CBC WITH DIFFERENTIAL/PLATELET
BASOS ABS: 0 10*3/uL (ref 0.0–0.1)
BASOS PCT: 0 %
Eosinophils Absolute: 0.1 10*3/uL (ref 0.0–0.7)
Eosinophils Relative: 1 %
HEMATOCRIT: 31.5 % — AB (ref 39.0–52.0)
Hemoglobin: 10.2 g/dL — ABNORMAL LOW (ref 13.0–17.0)
LYMPHS PCT: 18 %
Lymphs Abs: 1.3 10*3/uL (ref 0.7–4.0)
MCH: 28.3 pg (ref 26.0–34.0)
MCHC: 32.4 g/dL (ref 30.0–36.0)
MCV: 87.5 fL (ref 78.0–100.0)
Monocytes Absolute: 0.7 10*3/uL (ref 0.1–1.0)
Monocytes Relative: 9 %
NEUTROS ABS: 5 10*3/uL (ref 1.7–7.7)
Neutrophils Relative %: 72 %
Platelets: 169 10*3/uL (ref 150–400)
RBC: 3.6 MIL/uL — ABNORMAL LOW (ref 4.22–5.81)
RDW: 13.5 % (ref 11.5–15.5)
WBC: 7.1 10*3/uL (ref 4.0–10.5)

## 2017-01-05 LAB — BASIC METABOLIC PANEL
Anion gap: 9 (ref 5–15)
BUN: 40 mg/dL — AB (ref 6–20)
CO2: 20 mmol/L — ABNORMAL LOW (ref 22–32)
Calcium: 8.3 mg/dL — ABNORMAL LOW (ref 8.9–10.3)
Chloride: 108 mmol/L (ref 101–111)
Creatinine, Ser: 1.86 mg/dL — ABNORMAL HIGH (ref 0.61–1.24)
GFR calc Af Amer: 41 mL/min — ABNORMAL LOW (ref >60)
GFR calc non Af Amer: 36 mL/min — ABNORMAL LOW (ref >60)
Glucose, Bld: 159 mg/dL — ABNORMAL HIGH (ref 65–99)
Potassium: 4.2 mmol/L (ref 3.5–5.1)
SODIUM: 137 mmol/L (ref 135–145)

## 2017-01-05 LAB — TYPE AND SCREEN
ABO/RH(D): A POS
Antibody Screen: NEGATIVE

## 2017-01-05 LAB — ABO/RH: ABO/RH(D): A POS

## 2017-01-05 MED ORDER — IOPAMIDOL (ISOVUE-300) INJECTION 61%
INTRAVENOUS | Status: AC
  Administered 2017-01-05: 100 mL
  Filled 2017-01-05: qty 100

## 2017-01-05 MED ORDER — SODIUM CHLORIDE 0.9 % IV SOLN: Freq: Once | INTRAVENOUS | Status: DC

## 2017-01-05 MED ORDER — IPRATROPIUM-ALBUTEROL 0.5-2.5 (3) MG/3ML IN SOLN: 3.0000 mL | Freq: Four times a day (QID) | RESPIRATORY_TRACT | Status: DC | PRN

## 2017-01-05 MED ORDER — IOPAMIDOL (ISOVUE-300) INJECTION 61%
INTRAVENOUS | Status: AC
  Administered 2017-01-05: 30 mL
  Filled 2017-01-05: qty 30

## 2017-01-05 MED ORDER — SODIUM CHLORIDE 0.9 % IV SOLN
INTRAVENOUS | Status: DC
  Administered 2017-01-05 – 2017-01-06 (×3): via INTRAVENOUS

## 2017-01-05 NOTE — Progress Notes (Signed)
[facility] Progress Note     Subjective: CC: pain in left back and shoulder Patient states he has pain in left back and shoulder, better with getting up to chair and well controlled with medication this AM. Pulling 1000-1250 on IS. Denies SOB. Has had some lightheadedness and diaphoresis with getting up, but feels like this may be related to pain. Notes that BP is lower than usual. Denies dysuria or gross hematuria. Denies abdominal pain, n/v. Reports that he has had some kidney issues previously and has been told to avoid NSAIDs.   Objective: Vital signs in last 24 hours: Temp:  [97.5 F (36.4 C)-99.2 F (37.3 C)] 98.8 F (37.1 C) (10/03 0746) Pulse Rate:  [53-75] 75 (10/03 0335) Resp:  [18-26] 20 (10/03 0335) BP: (73-131)/(40-64) 103/57 (10/03 0746) SpO2:  [94 %-100 %] 94 % (10/03 0746) Weight:  [99.8 kg (220 lb)] 99.8 kg (220 lb) (10/02 1500) Last BM Date: 01/04/17  Intake/Output from previous day: 10/02 0701 - 10/03 0700 In: 2686.7 [P.O.:120; I.V.:2566.7] Out: 500 [Urine:500] Intake/Output this shift: No intake/output data recorded.  Physical Exam  Constitutional: He is oriented to person, place, and time. He appears well-developed and well-nourished. He is cooperative.  Non-toxic appearance. No distress.  HENT:  Head: Normocephalic and atraumatic.  Right Ear: External ear normal.  Left Ear: External ear normal.  Nose: Nose normal.  Eyes: Conjunctivae and lids are normal. No scleral icterus.  Neck: Normal range of motion. Neck supple.  Cardiovascular: Normal rate and regular rhythm.   Pulses:      Radial pulses are 2+ on the right side, and 2+ on the left side.       Dorsalis pedis pulses are 2+ on the right side, and 2+ on the left side.  Pulmonary/Chest: Effort normal.  Basilar rales on the left, right lung fields CTA  Abdominal: Soft. Normal appearance and bowel sounds are normal. There is no hepatosplenomegaly. There is no tenderness.  Genitourinary:   Genitourinary Comments: Condom catheter  Musculoskeletal:  LUE in sling, left fingers WWP, grip 5/5 bilaterally, ROM intact at left wrist and elbow.  Neurological: He is alert and oriented to person, place, and time. No sensory deficit.  Skin: Skin is warm, dry and intact. No rash noted. He is not diaphoretic. No pallor.  Psychiatric: He has a normal mood and affect. His speech is normal and behavior is normal.    Lab Results:   Recent Labs  01/04/17 1247 01/05/17 0215  WBC 15.3* 7.2  HGB 14.0 10.7*  HCT 41.7 31.0*  PLT 277 200   BMET  Recent Labs  01/04/17 1247 01/05/17 0215  NA 138 137  K 4.0 4.2  CL 105 108  CO2 23 20*  GLUCOSE 153* 159*  BUN 34* 40*  CREATININE 1.95* 1.86*  CALCIUM 8.9 8.3*   PT/INR  Recent Labs  01/04/17 1250  LABPROT 13.2  INR 1.01   CMP     Component Value Date/Time   NA 137 01/05/2017 0215   K 4.2 01/05/2017 0215   CL 108 01/05/2017 0215   CO2 20 (L) 01/05/2017 0215   GLUCOSE 159 (H) 01/05/2017 0215   BUN 40 (H) 01/05/2017 0215   CREATININE 1.86 (H) 01/05/2017 0215   CALCIUM 8.3 (L) 01/05/2017 0215   PROT 6.1 (L) 01/04/2017 1247   ALBUMIN 3.7 01/04/2017 1247   AST 51 (H) 01/04/2017 1247   ALT 43 01/04/2017 1247   ALKPHOS 37 (L) 01/04/2017 1247   BILITOT 0.7  01/04/2017 1247   GFRNONAA 36 (L) 01/05/2017 0215   GFRAA 41 (L) 01/05/2017 0215   Lipase  No results found for: LIPASE     Studies/Results: Ct Head Wo Contrast  Result Date: 01/04/2017 CLINICAL DATA:  Head trauma after fall. EXAM: CT HEAD WITHOUT CONTRAST CT CERVICAL SPINE WITHOUT CONTRAST TECHNIQUE: Multidetector CT imaging of the head and cervical spine was performed following the standard protocol without intravenous contrast. Multiplanar CT image reconstructions of the cervical spine were also generated. COMPARISON:  None. FINDINGS: CT HEAD FINDINGS Brain: No evidence of acute infarction, hemorrhage, hydrocephalus, extra-axial collection or mass lesion/mass  effect. Vascular: Atherosclerotic vascular calcification of the carotid siphons. No hyperdense vessel. Skull: Normal. Negative for fracture or focal lesion. Sinuses/Orbits: Partial opacification of the left maxillary sinus with high-density, inspissated secretions, and thickening of the maxillary sinus walls, consistent with chronic sinusitis. Mild mucosal thickening in the bilateral frontal sinuses, right maxillary sinus, and left greater than right ethmoid air cells. The orbits are unremarkable. Other: None. CT CERVICAL SPINE FINDINGS Alignment: Reversal of the normal cervical lordosis. No traumatic malalignment. Skull base and vertebrae: No acute fracture. No primary bone lesion or focal pathologic process. Soft tissues and spinal canal: No prevertebral fluid or swelling. No visible canal hematoma. Disc levels: Mild-to-moderate multilevel degenerative disc disease and facet uncovertebral hypertrophy throughout the cervical spine. Severe right neuroforaminal stenosis at C4-C5. Upper chest: Partially visualized left second and third posterior rib fractures. Please see separate CT chest report from same day for further details. Other: None. IMPRESSION: 1.  No acute intracranial abnormality. 2.  No acute cervical spine fracture. 3. Partially visualized left second and third posterior rib fractures. Please see separate CT chest report from same day for further details. Electronically Signed   By: Titus Dubin M.D.   On: 01/04/2017 13:36   Ct Chest W Contrast  Result Date: 01/04/2017 CLINICAL DATA:  Shoulder and chest pain after tree branch striking patient today. Initial encounter. EXAM: CT CHEST WITH CONTRAST TECHNIQUE: Multidetector CT imaging of the chest was performed during intravenous contrast administration. CONTRAST:  63mL ISOVUE-300 IOPAMIDOL (ISOVUE-300) INJECTION 61% COMPARISON:  Portable chest same date. FINDINGS: Cardiovascular: No acute vascular findings. There is atherosclerosis of the aorta,  great vessels and coronary arteries peer no abnormality of the pulmonary arteries identified. The heart size is normal. There is no pericardial effusion. Mediastinum/Nodes: There are no enlarged mediastinal, hilar or axillary lymph nodes.No evidence of mediastinal hematoma. There is an ill-defined left thyroid nodule, best seen on coronal image 40, measuring 2.6 cm. Small hiatal hernia. Lungs/Pleura: There is a small dependent left pleural effusion. No pneumothorax. There is perihilar airspace disease in the left upper lobe surrounding several air-fluid levels, likely pulmonary contusion with cavitation. There is mild dependent atelectasis in both lungs. No pneumomediastinum. Upper abdomen: No acute findings are seen within the visualized upper abdomen. There are low-density lesions in the upper pole of the right kidney and in the left hepatic lobe which are too small to characterize. Musculoskeletal/Chest wall: As demonstrated on the chest radiographs, there is a comminuted fracture involving the left scapular body, spine and base of the coracoid and acromion. No intra-articular extension into the glenohumeral joint is demonstrated. The humerus is located. The left clavicle appears intact. There is moderate surrounding hemorrhage within the trapezius, deltoid and rotator cuff musculature. There are multiple nondisplaced rib fractures on the left involving the costovertebral junctions, extending from the second through the seventh ribs. There are also nondisplaced fractures of  the adjacent transverse processes on the left. There is probable involvement of the left-sided articulating facets at T3, T4 and T5. No vertebral body fracture or intraspinal fracture fragments are seen. IMPRESSION: 1. Multifocal left upper lobe pulmonary contusion with cavitation. 2. Left hemothorax.  No pneumothorax or pneumomediastinum. 3. No evidence of mediastinal hematoma or great vessel injury. 4. Comminuted fracture of the left  scapula, involving the body, spine, coracoid and acromion. Associated adjacent intramuscular hematomas. 5. Multiple nondisplaced fractures involving the costovertebral junctions, transverse processes and articulating facets in the upper left chest. 6. Heterogeneous left thyroid nodule. Consider further evaluation with nonemergent thyroid ultrasound. This follows ACR consensus guidelines: Managing Incidental Thyroid Nodules Detected on Imaging: White Paper of the ACR Incidental Thyroid Findings Committee. J Am Coll Radiol 2015; 12:143-150. Electronically Signed   By: Richardean Sale M.D.   On: 01/04/2017 13:49   Ct Cervical Spine Wo Contrast  Result Date: 01/04/2017 CLINICAL DATA:  Head trauma after fall. EXAM: CT HEAD WITHOUT CONTRAST CT CERVICAL SPINE WITHOUT CONTRAST TECHNIQUE: Multidetector CT imaging of the head and cervical spine was performed following the standard protocol without intravenous contrast. Multiplanar CT image reconstructions of the cervical spine were also generated. COMPARISON:  None. FINDINGS: CT HEAD FINDINGS Brain: No evidence of acute infarction, hemorrhage, hydrocephalus, extra-axial collection or mass lesion/mass effect. Vascular: Atherosclerotic vascular calcification of the carotid siphons. No hyperdense vessel. Skull: Normal. Negative for fracture or focal lesion. Sinuses/Orbits: Partial opacification of the left maxillary sinus with high-density, inspissated secretions, and thickening of the maxillary sinus walls, consistent with chronic sinusitis. Mild mucosal thickening in the bilateral frontal sinuses, right maxillary sinus, and left greater than right ethmoid air cells. The orbits are unremarkable. Other: None. CT CERVICAL SPINE FINDINGS Alignment: Reversal of the normal cervical lordosis. No traumatic malalignment. Skull base and vertebrae: No acute fracture. No primary bone lesion or focal pathologic process. Soft tissues and spinal canal: No prevertebral fluid or swelling.  No visible canal hematoma. Disc levels: Mild-to-moderate multilevel degenerative disc disease and facet uncovertebral hypertrophy throughout the cervical spine. Severe right neuroforaminal stenosis at C4-C5. Upper chest: Partially visualized left second and third posterior rib fractures. Please see separate CT chest report from same day for further details. Other: None. IMPRESSION: 1.  No acute intracranial abnormality. 2.  No acute cervical spine fracture. 3. Partially visualized left second and third posterior rib fractures. Please see separate CT chest report from same day for further details. Electronically Signed   By: Titus Dubin M.D.   On: 01/04/2017 13:36   Dg Chest Port 1 View  Result Date: 01/05/2017 CLINICAL DATA:  Multiple left-sided rib fractures after being struck by a tree branch yesterday. EXAM: PORTABLE CHEST 1 VIEW COMPARISON:  Portable chest x-ray and chest CT scan of January 04, 2017 FINDINGS: The lungs are adequately inflated. There small bilateral pleural effusions. There is no pneumothorax. Increased density in the left mid lung is present today. The cardiac silhouette is enlarged. The pulmonary vascularity is not engorged. The trachea is midline. Previously demonstrated scapular fractures are again demonstrated. Known multiple posteromedial left rib fractures are faintly visible. IMPRESSION: No pneumothorax. Small bilateral pleural effusions. Increased parenchymal density on the left may reflect a pulmonary contusion or pulmonary hemorrhage. Certainly pneumonia could produce similar findings. No CHF. Multiple left rib and scapular fractures. Electronically Signed   By: David  Martinique M.D.   On: 01/05/2017 08:01   Dg Chest Port 1 View  Result Date: 01/04/2017 CLINICAL DATA:  Hit in  the back by a tree limb EXAM: PORTABLE CHEST 1 VIEW COMPARISON:  None. FINDINGS: Low volume film. Right lung clear. Hazy opacity overlying the left lung may be related to lung contusion. No evidence for  pneumothorax or substantial pleural effusion. The cardio pericardial silhouette is enlarged. Complex fracture noted left scapula. Telemetry leads overlie the chest. IMPRESSION: 1. Complex left scapular fracture. 2. Decreased volume left hemithorax with haziness over the left lung. Atelectasis or lung contusion could have this appearance. Electronically Signed   By: Misty Stanley M.D.   On: 01/04/2017 11:33    Anti-infectives: Anti-infectives    None       Assessment/Plan Patient struck by tree limb Multiple Left posterior rib FX - with involvement of CV junctions, TP, and articulated facets. pain control, IS/pulm toilet Left HTX - repeat CXR without PTX and slightly increased hemothorax, will discuss with MD if chest tube is needed - Hgb 10.7 from 14, continue to monitor Left pulmonary contusion with cavitation  Comminuted left scapula fracture - non-op management , f/u with Dr. Lorin Mercy in 2 weeks - will need PT when he has some scapular healing Thoracic transverse process fractures, left  AKI - Cr trending down, continue to monitor Heterogenous left thyroid nodule - incidental finding on CT, outpatient US/workup at hospital discharge   FEN: IVF, regular diet ID: None VTE: SCD's, hold lovenox for drop in Hgb  Dispo: SDU. Repeat CXR and CBC this afternoon. Pain control.   LOS: 1 day    Brigid Re , [facility] 01/05/2017, 9:01 AM Pager: [phone] Trauma Pager: [phone] Mon-Fri 7:00 am-4:30 pm Sat-Sun 7:00 am-11:30 am

## 2017-01-05 NOTE — Evaluation (Signed)
Occupational Therapy Evaluation Patient Details Name: Joshua Durham MRN: [id number] DOB: [birth year] Today's Date: 01/05/2017    History of Present Illness Pt is a 66 y.o. male who presented to the ED after a large tree limb fell on his back. Found to have multiple L posterior rib fractures, L hemothorax, L pulmonary contusion wtih cavitation, L thoracic transverse process fractures, and L scapular fracture. PMH significant for hypertension.    Clinical Impression   PTA, pt was independent with ADL and functional mobility and works full time as a Psychologist, sport and exercise. Pt currently requires max assist for UB and LB ADL and min assist for ambulating toilet transfers. He presents with significant L rib, L shoulder, and back pain impacting his ability to participate in ADL at [facility]. Pt and wife educated on compensatory strategies for UB ADL, sling wear schedule, L elbow/wrist/hand AROM, and precautions for L shoulder. They verbalize understanding. Pt would benefit from continued OT services while admitted to maximize independence and safety with ADL and functional mobility. Feel pt will likely progress well and be able to return home with 24 hour assistance from his wife. Recommend outpatient OT follow-up once L scapula has healed and cleared by MD.    Follow Up Recommendations  Supervision/Assistance - 24 hour (Follow up with outpatient OT once L scapula healed)    Equipment Recommendations  3 in 1 bedside commode;Tub/shower seat    Recommendations for Other Services       Precautions / Restrictions Precautions Precautions: Shoulder Type of Shoulder Precautions: No shoulder movement per Dr. Lorin Mercy Shoulder Interventions: Shoulder sling/immobilizer Restrictions Weight Bearing Restrictions: Yes LUE Weight Bearing: Non weight bearing      Mobility Bed Mobility Overal bed mobility: Needs Assistance Bed Mobility: Rolling;Sidelying to Sit Rolling: Mod assist Sidelying to sit: Mod assist       General  bed mobility comments: mod assist to manage trunk during rolling and rise from [facility]  Transfers Overall transfer level: Needs assistance Equipment used:  (L shoulder sling)                  Balance Overall balance assessment: No apparent balance deficits (not formally assessed)                                         ADL either performed or assessed with clinical judgement   ADL Overall ADL's : Needs assistance/impaired Eating/Feeding: Sitting;Minimal assistance   Grooming: Set up;Sitting   Upper Body Bathing: Sitting;Maximal assistance   Lower Body Bathing: Maximal assistance;Sit to/from stand   Upper Body Dressing : Maximal assistance;Sitting   Lower Body Dressing: Maximal assistance;Sit to/from stand   Toilet Transfer: Minimal assistance;Ambulation;BSC Toilet Transfer Details (indicate cue type and reason): Taking a few steps for simulated toilet transfer.  Toileting- Clothing Manipulation and Hygiene: Sit to/from stand;Maximal assistance       Functional mobility during ADLs: Minimal assistance General ADL Comments: Pt limited by pain. Educated pt and wife concerning need for pt to participate in self-care tasks to maximize functional recovery.      Vision Patient Visual Report: No change from baseline Vision Assessment?: No apparent visual deficits     Perception     Praxis      Pertinent Vitals/Pain Pain Assessment: Faces Faces Pain Scale: Hurts whole lot Pain Location: L ribs, L scapula, back Pain Descriptors / Indicators: Aching;Discomfort;Guarding;Sore Pain Intervention(s): Limited activity within patient's tolerance;Monitored  during session;Repositioned;Relaxation     Hand Dominance Right   Extremity/Trunk Assessment Upper Extremity Assessment Upper Extremity Assessment: LUE deficits/detail LUE Deficits / Details: per Dr. Lorin Mercy - no shoulder movement while scapular fracture heals. Sensation is in tact and wrist/hand movement in  tact.   Lower Extremity Assessment Lower Extremity Assessment: Overall WFL for tasks assessed       Communication Communication Communication: No difficulties   Cognition Arousal/Alertness: Awake/alert Behavior During Therapy: Anxious Overall Cognitive Status: Within Functional Limits for tasks assessed                                     General Comments       Exercises     Shoulder Instructions      Home Living Family/patient expects to be discharged to:: Private residence Living Arrangements: Spouse/significant other   Type of Home: House Home Access: Stairs to enter [employer] of Steps: 3 Entrance Stairs-Rails: None Home Layout: One level     Bathroom Shower/Tub: Tub/shower unit;Walk-in shower (going to use walk-in)   Bathroom Toilet: Handicapped height Bathroom Accessibility: Yes   Home Equipment: Bedside commode          Prior Functioning/Environment Level of Independence: Independent        Comments: Farming        OT Problem List: Decreased strength;Decreased range of motion;Decreased activity tolerance;Impaired balance (sitting and/or standing);Decreased safety awareness;Decreased knowledge of use of DME or AE;Decreased knowledge of precautions;Pain;Impaired UE functional use      OT Treatment/Interventions: Self-care/ADL training;Therapeutic exercise;Energy conservation;DME and/or AE instruction;Therapeutic activities;Patient/family education;Balance training    OT Goals(Current goals can be found in the care plan section) Acute Rehab OT Goals Patient Stated Goal: less pain OT Goal Formulation: With patient/family Time For Goal Achievement: 01/19/17 Potential to Achieve Goals: Good ADL Goals Pt Will Perform Grooming: with supervision;sitting Pt Will Perform Upper Body Dressing: with min assist;sitting Pt Will Perform Lower Body Dressing: with min assist;sit to/from stand Pt Will Transfer to Toilet: with  supervision;ambulating (comfort height toilet) Pt Will Perform Toileting - Clothing Manipulation and hygiene: with min assist;sit to/from stand Pt/caregiver will Perform Home Exercise Program: With Supervision;Left upper extremity;With written HEP provided (elbow/wrist/hand AROM)  OT Frequency: Min 2X/week   Barriers to D/C:            Co-evaluation              AM-PAC PT "6 Clicks" Daily Activity     Outcome Measure Help from another person eating meals?: A Little Help from another person taking care of personal grooming?: A Little Help from another person toileting, which includes using toliet, bedpan, or urinal?: A Lot Help from another person bathing (including washing, rinsing, drying)?: A Lot Help from another person to put on and taking off regular upper body clothing?: A Lot Help from another person to put on and taking off regular lower body clothing?: A Lot 6 Click Score: 14   End of Session Equipment Utilized During Treatment:  (L shoulder sling) Nurse Communication: Mobility status  Activity Tolerance: Patient tolerated treatment well Patient left: in chair;with call bell/phone within reach;with family/visitor present  OT Visit Diagnosis: Other abnormalities of gait and mobility (R26.89);Pain Pain - Right/Left: Left Pain - part of body: Shoulder                Time: 8101-7510 OT Time Calculation (min): 43 min Charges:  OT  General Charges $OT Visit: 1 Visit OT Evaluation $OT Eval Moderate Complexity: 1 Mod OT Treatments $Self Care/Home Management : 23-37 mins G-Codes:     Norman Herrlich, MS OTR/L  Pager: Alma A Shanan Fitzpatrick 01/05/2017, 10:58 AM

## 2017-01-05 NOTE — Evaluation (Signed)
Physical Therapy Evaluation Patient Details Name: Joshua Durham MRN: [id number] DOB: 01/12/1951 Today's Date: 01/05/2017   History of Present Illness  Pt is a 66 y.o. male who presented to the ED after a large tree limb fell on his back. Found to have multiple L posterior rib fractures, L hemothorax, L pulmonary contusion wtih cavitation, L thoracic transverse process fractures, and L scapular fracture. PMH significant for hypertension.   Clinical Impression  Pt admitted with above diagnosis. Pt currently with functional limitations due to the deficits listed below (see PT Problem List). Pt is limited by L UE pain with motion. Pt currently modA for bed mobility, minA for transfers and min guard for ambulation of 5 feet without AD. Pt will benefit from skilled PT to increase their independence and safety with mobility in the acute setting to allow discharge to the venue listed below.       Follow Up Recommendations No PT follow up;Supervision for mobility/OOB    Equipment Recommendations  None recommended by PT       Precautions / Restrictions Precautions Precautions: Shoulder Type of Shoulder Precautions: No shoulder movement per Dr. Lorin Mercy Shoulder Interventions: Shoulder sling/immobilizer Restrictions Weight Bearing Restrictions: Yes LUE Weight Bearing: Non weight bearing      Mobility  Bed Mobility Overal bed mobility: Needs Assistance Bed Mobility: Rolling;Sidelying to Sit;Sit to Sidelying Rolling: Mod assist Sidelying to sit: Mod assist;HOB elevated     Sit to sidelying: Mod assist General bed mobility comments: mod assist to manage trunk to upright, vc for bending L LE to roll to R side, and push up with R UE, modA for sitting to sidelying to bring LE into bed   Transfers Overall transfer level: Needs assistance Equipment used:  (L shoulder sling) Transfers: Sit to/from Stand Sit to Stand: Min assist;From elevated surface         General transfer comment: minA  for power up, vc for scooting to EoB   Ambulation/Gait Ambulation/Gait assistance: Min guard Ambulation Distance (Feet): 5 Feet Assistive device: None Gait Pattern/deviations: Step-through pattern;Decreased step length - right;Decreased step length - left Gait velocity: slowed Gait velocity interpretation: Below normal speed for age/gender General Gait Details: pt getting ready to go for CT scan limiting duration of gait, no LoB, no buckling      Balance Overall balance assessment: No apparent balance deficits (not formally assessed)                                           Pertinent Vitals/Pain Pain Assessment: 0-10 Pain Score: 8  Pain Location: L ribs, L scapula, back Pain Descriptors / Indicators: Aching;Discomfort;Guarding;Sore Pain Intervention(s): Limited activity within patient's tolerance;Monitored during session    [place] expects to be discharged to:: Private residence Living Arrangements: Spouse/significant other   Type of Home: House Home Access: Stairs to enter Entrance Stairs-Rails: None Technical brewer of Steps: 3 Home Layout: One level Home Equipment: Bedside commode      Prior Function Level of Independence: Independent         Comments: Farming     Hand Dominance   Dominant Hand: Right    Extremity/Trunk Assessment   Upper Extremity Assessment Upper Extremity Assessment: Defer to OT evaluation    Lower Extremity Assessment Lower Extremity Assessment: Overall WFL for tasks assessed       Communication   Communication: No difficulties  Cognition  Arousal/Alertness: Awake/alert Behavior During Therapy: Anxious Overall Cognitive Status: Within Functional Limits for tasks assessed                                        General Comments General comments (skin integrity, edema, etc.): BP in supine 125/56, slight dizziness with sitting BP 113/62, in standing BP 110/60            Assessment/Plan    PT Assessment Patient needs continued PT services  PT Problem List Pain;Decreased activity tolerance;Decreased mobility       PT Treatment Interventions DME instruction;Gait training;Stair training;Functional mobility training;Therapeutic activities;Therapeutic exercise;Balance training;Patient/family education    PT Goals (Current goals can be found in the Care Plan section)  Acute Rehab PT Goals Patient Stated Goal: less pain PT Goal Formulation: With patient Time For Goal Achievement: 01/12/17 Potential to Achieve Goals: Good    Frequency Min 3X/week    AM-PAC PT "6 Clicks" Daily Activity  Outcome Measure Difficulty turning over in bed (including adjusting bedclothes, sheets and blankets)?: Unable Difficulty moving from lying on back to sitting on the side of the bed? : Unable Difficulty sitting down on and standing up from a chair with arms (e.g., wheelchair, bedside commode, etc,.)?: Unable Help needed moving to and from a bed to chair (including a wheelchair)?: A Little Help needed walking in hospital room?: A Little Help needed climbing 3-5 steps with a railing? : A Lot 6 Click Score: 11    End of Session Equipment Utilized During Treatment: Gait belt Activity Tolerance: Patient tolerated treatment well Patient left: in bed;with call bell/phone within reach;with family/visitor present Nurse Communication: Mobility status PT Visit Diagnosis: Other abnormalities of gait and mobility (R26.89);Pain;Dizziness and giddiness (R42) Pain - Right/Left: Left Pain - part of body: Shoulder    Time: 2130-8657 PT Time Calculation (min) (ACUTE ONLY): 21 min   Charges:   PT Evaluation $PT Eval Low Complexity: 1 Low     PT G Codes:        Ednamae Schiano B. Migdalia Dk PT, DPT Acute Rehabilitation  [phone] Pager [phone]    Vazquez 01/05/2017, 4:54 PM

## 2017-01-06 ENCOUNTER — Inpatient Hospital Stay (HOSPITAL_COMMUNITY): Payer: Medicare Other

## 2017-01-06 LAB — BASIC METABOLIC PANEL
ANION GAP: 8 (ref 5–15)
BUN: 25 mg/dL — ABNORMAL HIGH (ref 6–20)
CALCIUM: 8.6 mg/dL — AB (ref 8.9–10.3)
CHLORIDE: 102 mmol/L (ref 101–111)
CO2: 25 mmol/L (ref 22–32)
Creatinine, Ser: 1.25 mg/dL — ABNORMAL HIGH (ref 0.61–1.24)
GFR calc non Af Amer: 57 mL/min — ABNORMAL LOW (ref >60)
Glucose, Bld: 123 mg/dL — ABNORMAL HIGH (ref 65–99)
Potassium: 3.7 mmol/L (ref 3.5–5.1)
SODIUM: 135 mmol/L (ref 135–145)

## 2017-01-06 LAB — CBC
HEMATOCRIT: 30.2 % — AB (ref 39.0–52.0)
HEMOGLOBIN: 10 g/dL — AB (ref 13.0–17.0)
MCH: 28.8 pg (ref 26.0–34.0)
MCHC: 33.1 g/dL (ref 30.0–36.0)
MCV: 87 fL (ref 78.0–100.0)
Platelets: 175 10*3/uL (ref 150–400)
RBC: 3.47 MIL/uL — ABNORMAL LOW (ref 4.22–5.81)
RDW: 13.4 % (ref 11.5–15.5)
WBC: 7 10*3/uL (ref 4.0–10.5)

## 2017-01-06 MED ORDER — METHOCARBAMOL 750 MG PO TABS
750.0000 mg | ORAL_TABLET | Freq: Three times a day (TID) | ORAL | Status: DC | PRN
  Administered 2017-01-06 – 2017-01-07 (×3): 750 mg via ORAL
  Filled 2017-01-06 (×3): qty 1

## 2017-01-06 MED ORDER — OXYMETAZOLINE HCL 0.05 % NA SOLN
1.0000 | Freq: Two times a day (BID) | NASAL | Status: DC
  Administered 2017-01-06: 1 via NASAL
  Filled 2017-01-06 (×2): qty 15

## 2017-01-06 MED ORDER — TRAMADOL HCL 50 MG PO TABS
100.0000 mg | ORAL_TABLET | Freq: Four times a day (QID) | ORAL | Status: DC
  Administered 2017-01-06 – 2017-01-07 (×4): 100 mg via ORAL
  Filled 2017-01-06 (×4): qty 2

## 2017-01-06 NOTE — Consult Note (Signed)
[facility] CM Primary Care Navigator  01/06/2017  DELAN KSIAZEK March 23, 1951 [id number]   Met with patientand wife Remo Lipps) at the bedside to identify possible discharge needs.  Patientreports that he was [place] large, [place] and hit him on his back that led to this admission.  Patient endorses Dr. Mayra Neer with [facility] as his primary care provider.  [facility] without any problem. Patient manages his medications at home with some help from wife using "pill box" system filled weekly.  Patient reportsthat he was driving prior to admission but wife will provide transportation to hisdoctors'appointments after discharge.  Wife will be the primary caregiver for patient at home.  Anticipated discharge plan is home according to patient. Wife mentioned that therapy will be done once his left shoulder heals.  Patientand wife expressed understanding to callprimary care provider's officewhen he returnshome for a post discharge follow-up appointment within a week or sooner if needed. Patient letter (with PCP's contact number) was provided as their reminder. Wife mentioned that patient has a scheduled appointment to see primary care provider in 2 weeks time anyway.  Explained to patientand wife about [facility] CM services available for health management at home but hedeniesany current needs or concerns at this time. However, opted and verbally agreed forEMMI calls tofollow-up with his recoveryat home.   Referral to [employer] calls made after discharge.  Patient and wife voiced understanding to seek referral from primary care provider to [facility] care management if necessary and appropriate for services in the future.  [facility] care management information provided for future needs that he may have.   For questions, please  contact:  Dannielle Huh, BSN, RN- [facility] Primary Care Navigator  Telephone: [phone] [place]

## 2017-01-06 NOTE — Discharge Instructions (Signed)
Hemothorax Hemothorax is a buildup of blood between your lung and the wall of your chest (pleural cavity). It is usually caused by an injury that results in bleeding. Hemothorax can be a dangerous condition. As blood builds up in the pleural cavity, it can press on your lung and make it hard for you to breathe. Your lung may collapse. This means that air leaks from your lung and builds up in your pleural cavity(pneumothorax). This prevents your lung from expanding. What are the causes? An injury (trauma) that causes a tear in a lung or in a blood vessel in the chest is the main cause of hemothorax. Other possible causes include:  Tuberculosis.  An injury caused by placing a tube into a blood vessel in the chest (central venous catheter).  Cancer in the chest.  A blood-clotting problem.  Blood-thinning medicine.  Lung or heart surgery.  What are the signs or symptoms? Signs and symptoms may include:  Rapid breathing.  Difficulty breathing.  Shortness of breath.  Feeling light-headed.  Anxiety.  Restlessness.  A rapid heart rate.  Low blood pressure (hypotension).  Chest pain.  Cool, pale, blue, or sweaty skin.  How is this diagnosed? Your health care provider may suspect a hemothorax from your signs and symptoms, especially if you had a recent injury. Your health care provider will also do a physical exam. This includes checking your breathing. You may also have a chest X-ray. If the cause of the hemothorax is not known, fluid from the pleural space may be removed for testing. How is this treated? You may have an IV line started to give you fluids or blood from a donor (transfusion). Treatment usually includes placing a small, flexible tube (chest tube) into the pleural cavity to drain fluid, blood, or extra air. A chest tube can also re-expand your lung if it collapses. If bleeding continues after the chest tube is in place, you may need surgery to open the chest  (thoracotomy) and control the bleeding. This information is not intended to replace advice given to you by your health care provider. Make sure you discuss any questions you have with your health care provider. Document Released: 12/17/2003 Document Revised: 08/28/2015 Document Reviewed: 12/26/2013 Elsevier Interactive Patient Education  [employer].  1. PAIN CONTROL:  1. Pain is best controlled by a usual combination of three different methods TOGETHER:  1. Ice/Heat 2. Over the counter pain medication 3. Prescription pain medication 2. Most patients will experience some swelling and bruising around wounds. Ice packs or heating pads (30-60 minutes up to 6 times a day) will help. Use ice for the first few days to help decrease swelling and bruising, then switch to heat to help relax tight/sore spots and speed recovery. Some people prefer to use ice alone, heat alone, alternating between ice & heat. Experiment to what works for you. Swelling and bruising can take several weeks to resolve.  3. It is helpful to take an over-the-counter pain medication regularly for the first few weeks. Choose one of the following that works best for you:  1. Naproxen (Aleve, etc) Two 220mg  tabs twice a day 2. Ibuprofen (Advil, etc) Three 200mg  tabs four times a day (every meal & bedtime) 3. Acetaminophen (Tylenol, etc) 500-650mg  four times a day (every meal & bedtime) 4. A prescription for pain medication (such as oxycodone, hydrocodone, etc) should be given to you upon discharge. Take your pain medication as prescribed.  1. If you are having problems/concerns with the  prescription medicine (does not control pain, nausea, vomiting, rash, itching, etc), please call us [phone] to see if we need to switch you to a different pain medicine that will work better for you and/or control your side effect better. 2. If you need a refill on your pain medication, please contact your pharmacy. They will contact our  office to request authorization. Prescriptions will not be filled after 5 pm or on week-ends. 4. Avoid getting constipated. When taking pain medications, it is common to experience some constipation. Increasing fluid intake and taking a fiber supplement (such as Metamucil, Citrucel, FiberCon, MiraLax, etc) 1-2 times a day regularly will usually help prevent this problem from occurring. A mild laxative (prune juice, Milk of Magnesia, MiraLax, etc) should be taken according to package directions if there are no bowel movements after 48 hours.  5. Watch out for diarrhea. If you have many loose bowel movements, simplify your diet to bland foods & liquids for a few days. Stop any stool softeners and decrease your fiber supplement. Switching to mild anti-diarrheal medications (Kayopectate, Pepto Bismol) can help. If this worsens or does not improve, please call us. 6. Wash / shower every day. You may shower daily and replace your bandges after showering. No bathing or submerging your wounds in water until they heal. 7. FOLLOW UP in our office  1. Please call [facility] at [phone] to set up an appointment for a follow-up appointment approximately 2-3 weeks after discharge for wound check  WHEN TO CALL us [phone]:  1. Poor pain control 2. Reactions / problems with new medications (rash/itching, nausea, etc)  3. Fever over 101.5 F (38.5 C) 4. Worsening swelling or bruising 5. Continued bleeding from wounds. 6. Increased pain, redness, or drainage from the wounds which could be signs of infection  The clinic staff is available to answer your questions during regular business hours (8:30am-5pm). Please dont hesitate to call and ask to speak to one of our nurses for clinical concerns.  If you have a medical emergency, go to the nearest emergency room or call 911.  A surgeon from [facility] is always on call at the [facility], [place], [place], [place], [place] [address] ?  MAIN: [phone] ? TOLL FREE: [phone] ?  FAX [phone]  [email or web address]

## 2017-01-06 NOTE — Discharge Summary (Signed)
Physician Discharge Summary  Patient ID: Joshua Durham MRN: [id number] DOB/AGE: [birth year] 66 y.o.  Admit date: 01/04/2017 Discharge date: 01/07/2017  Discharge Diagnoses Struck in the back by tree limb Multiple left posterior rib fractures Left hemothorax Left pulmonary contusion with cavitation  Comminuted left scapula  Thoracic transverse process fractures on the left AKI - resolved ABL anemia - resolved Incidental finding of heterogenous left thyroid nodule Incidental findings of  liver cyst and right renal lesion  Consultants Orthopedic surgery  Procedures None  HPI: Joshua Durham is a 66 y/o male with a PMH of HTN who presented to the ED via EMS as a non-trauma activation after a large tree limb fell on his back. The patient reports +LOC and is amnestic to the event but, according to a bystander, the patient was cutting down a very large, dead tree limb [place] farm when it fell and hit him on the left side of his back. The patient recalls the limb falling but does not remember where it hit him or moving from the scene of the accident. He was helped back into his house before EMS was called. He arrived to the ED with cc L upper back pain and SOB. He denies HA, CP, abdominal pain, or pain in extremities. He is a non-smoker. Denies history of diabetes or MI. Has no history of surgery. NKDA. Reports that his PCP is Dr. Mayra Neer. His wife reports that he may have had abnormal kidney function in the past. Workup in the ED revealed above listed injuries.  Hospital Course: Patient was admitted to the SDU to closely monitor pulmonary status. Orthopedic surgery was consulted and recommended non-operative treatment with outpatient follow up in 2 weeks. Patient initially had a drop in Hgb 10/3 which stabilized by 10/4. Patient also had initial worsening of left hemothorax 10/3, but it was felt that this could be managed with close monitoring rather than inserting chest tube at that time. Repeat  CXR 10/4 was improved. 01/07/17 the patient's CXR was stable, Hgb stable and patient overall felt stable for discharge home. PT/OT evaluated patient and recommended outpatient OT when cleared by orthopedic surgery. He will follow up with orthopedic surgery and the trauma clinic.   Patient had some incidental findings on imaging of thyroid nodule, liver cyst and right renal lesion. These findings were discussed with the patient and recommended that he follow up with PCP for further evaluation regarding findings.   Physical Exam: Gen:  Alert, NAD, pleasant Card:  Regular rate and rhythm, pedal pulses 2+ BL Pulm:  Normal effort, clear to auscultation bilaterally Abd: Soft, non-tender, non-distended, bowel sounds present, no HSM Ext: LUE out of sling, TTP of left shoulder, mild edema and ecchymosis of left shoulder, ROM intact in left elbow and wrist Skin: warm and dry, no rashes  Psych: A&Ox3   Allergies as of 01/07/2017   No Known Allergies     Medication List    TAKE these medications   acetaminophen 650 MG CR tablet Commonly known as:  TYLENOL Take 650-1,300 mg by mouth every 8 (eight) hours as needed for pain.   aspirin EC 81 MG tablet Take 81 mg by mouth at bedtime.   atenolol 50 MG tablet Commonly known as:  TENORMIN Take 50 mg by mouth daily.   chlorthalidone 25 MG tablet Commonly known as:  HYGROTON Take 25 mg by mouth daily with breakfast.   COLCRYS 0.6 MG tablet Generic drug:  colchicine Take 0.6 mg by mouth 3 (  three) times daily as needed (for gout flares).   Fenofibric Acid 135 MG Cpdr Take 135 mg by mouth daily.   Fish Oil 1000 MG Caps Take 1,000-2,000 mg by mouth See admin instructions. 2,000 mg in the morning and 1,000 mg in the evening   lisinopril 5 MG tablet Commonly known as:  PRINIVIL,ZESTRIL Take 5 mg by mouth at bedtime.   methocarbamol 750 MG tablet Commonly known as:  ROBAXIN Take 1 tablet (750 mg total) by mouth every 8 (eight) hours as needed  for muscle spasms.   niacin 1000 MG CR tablet Commonly known as:  NIASPAN Take 1,000 mg by mouth at bedtime.   Oxycodone HCl 10 MG Tabs Take 0.5-1 tablets (5-10 mg total) by mouth every 4 (four) hours as needed for severe pain.   potassium chloride SA 20 MEQ tablet Commonly known as:  K-DUR,KLOR-CON Take 20 mEq by mouth daily with breakfast.   simvastatin 40 MG tablet Commonly known as:  ZOCOR Take 40 mg by mouth at bedtime.   traMADol 50 MG tablet Commonly known as:  ULTRAM Take 1-2 tablets (50-100 mg total) by mouth every 6 (six) hours as needed.            Durable Medical Equipment        Start     Ordered   01/06/17 [phone]  For home use only DME Shower stool  Once     01/06/17 0823   01/06/17 0823  For home use only DME 3 n 1  Once     01/06/17 4917       Follow-up Information    Marybelle Killings, MD Follow up in 2 week(s).   Specialty:  Orthopedic Surgery Contact information: [place] [address] [phone]        [place]. Go on 01/18/2017.   Why:  Your appointment is at 9:30, arrive 9:00 AM. Bring photo ID and any insurance information with you. You will need to go to [facility] for chest x-ray the day prior, if they have any issue with the order have them call the office.  Contact information: [place] [address] [phone]       Mayra Neer, MD Follow up.   Specialty:  Family Medicine Why:  Follow up with your primary care provider about incidental finding of liver cyst and renal lesion. Radiology recommends MRI if more complete imaging needed.  Contact information: [address] [place] [address] [phone]           Signed: Brigid Re , [facility] 01/07/2017, 9:14 AM Pager: [phone] Trauma: [phone] Mon-Fri 7:00 am-4:30 pm Sat-Sun 7:00 am-11:30 am

## 2017-01-06 NOTE — Progress Notes (Signed)
[facility] Progress Note     Subjective: CC: left shoulder pain Patient is resting comfortably in bed this morning, wife present at bedside. Patient has left shoulder pain with movement, pain well controlled with medication. Reports Oxy makes him feel wired at night. Denies palpitations, SOB. Patient reports some sinus drainage that is irritating to back of throat and then pain with trying to cough. Pulling 1250 on IS.  Objective: Vital signs in last 24 hours: Temp:  [98.3 F (36.8 C)-99.2 F (37.3 C)] 98.6 F (37 C) (10/04 0515) Pulse Rate:  [84-99] 84 (10/04 0515) Resp:  [17-26] 18 (10/04 0515) BP: (107-134)/(52-71) 113/62 (10/04 0515) SpO2:  [93 %-97 %] 96 % (10/04 0515) Last BM Date: 01/04/17  Intake/Output from previous day: 10/03 0701 - 10/04 0700 In: 2991.7 [P.O.:1000; I.V.:1691.7] Out: 4235 [Urine:3275] Intake/Output this shift: No intake/output data recorded.  PE: Gen:  Alert, NAD, pleasant Card:  Regular rate and rhythm, pedal pulses 2+ BL Pulm:  Normal effort, clear to auscultation bilaterally Abd: Soft, non-tender, non-distended, bowel sounds present, no HSM Ext: LUE out of sling, TTP of left shoulder, mild edema and ecchymosis of left shoulder, ROM intact in left elbow and wrist Skin: warm and dry, no rashes  Psych: A&Ox3   Lab Results:   Recent Labs  01/05/17 1830 01/06/17 0314  WBC 7.1 7.0  HGB 10.2* 10.0*  HCT 31.5* 30.2*  PLT 169 175   BMET  Recent Labs  01/05/17 0215 01/06/17 0314  NA 137 135  K 4.2 3.7  CL 108 102  CO2 20* 25  GLUCOSE 159* 123*  BUN 40* 25*  CREATININE 1.86* 1.25*  CALCIUM 8.3* 8.6*   PT/INR  Recent Labs  01/04/17 1250  LABPROT 13.2  INR 1.01   CMP     Component Value Date/Time   NA 135 01/06/2017 0314   K 3.7 01/06/2017 0314   CL 102 01/06/2017 0314   CO2 25 01/06/2017 0314   GLUCOSE 123 (H) 01/06/2017 0314   BUN 25 (H) 01/06/2017 0314   CREATININE 1.25 (H) 01/06/2017 0314   CALCIUM 8.6  (L) 01/06/2017 0314   PROT 6.1 (L) 01/04/2017 1247   ALBUMIN 3.7 01/04/2017 1247   AST 51 (H) 01/04/2017 1247   ALT 43 01/04/2017 1247   ALKPHOS 37 (L) 01/04/2017 1247   BILITOT 0.7 01/04/2017 1247   GFRNONAA 57 (L) 01/06/2017 0314   GFRAA >60 01/06/2017 0314   Lipase  No results found for: LIPASE     Studies/Results: Ct Head Wo Contrast  Result Date: 01/04/2017 CLINICAL DATA:  Head trauma after fall. EXAM: CT HEAD WITHOUT CONTRAST CT CERVICAL SPINE WITHOUT CONTRAST TECHNIQUE: Multidetector CT imaging of the head and cervical spine was performed following the standard protocol without intravenous contrast. Multiplanar CT image reconstructions of the cervical spine were also generated. COMPARISON:  None. FINDINGS: CT HEAD FINDINGS Brain: No evidence of acute infarction, hemorrhage, hydrocephalus, extra-axial collection or mass lesion/mass effect. Vascular: Atherosclerotic vascular calcification of the carotid siphons. No hyperdense vessel. Skull: Normal. Negative for fracture or focal lesion. Sinuses/Orbits: Partial opacification of the left maxillary sinus with high-density, inspissated secretions, and thickening of the maxillary sinus walls, consistent with chronic sinusitis. Mild mucosal thickening in the bilateral frontal sinuses, right maxillary sinus, and left greater than right ethmoid air cells. The orbits are unremarkable. Other: None. CT CERVICAL SPINE FINDINGS Alignment: Reversal of the normal cervical lordosis. No traumatic malalignment. Skull base and vertebrae: No acute fracture. No primary bone lesion  or focal pathologic process. Soft tissues and spinal canal: No prevertebral fluid or swelling. No visible canal hematoma. Disc levels: Mild-to-moderate multilevel degenerative disc disease and facet uncovertebral hypertrophy throughout the cervical spine. Severe right neuroforaminal stenosis at C4-C5. Upper chest: Partially visualized left second and third posterior rib fractures.  Please see separate CT chest report from same day for further details. Other: None. IMPRESSION: 1.  No acute intracranial abnormality. 2.  No acute cervical spine fracture. 3. Partially visualized left second and third posterior rib fractures. Please see separate CT chest report from same day for further details. Electronically Signed   By: Titus Dubin M.D.   On: 01/04/2017 13:36   Ct Chest W Contrast  Addendum Date: 01/05/2017   ADDENDUM REPORT: 01/05/2017 11:24 ADDENDUM: Upon further review, there is a minimal superior endplate compression deformity at T11 as well, without associated osseous retropulsion. Electronically Signed   By: Richardean Sale M.D.   On: 01/05/2017 11:24   Result Date: 01/05/2017 CLINICAL DATA:  Shoulder and chest pain after tree branch striking patient today. Initial encounter. EXAM: CT CHEST WITH CONTRAST TECHNIQUE: Multidetector CT imaging of the chest was performed during intravenous contrast administration. CONTRAST:  98mL ISOVUE-300 IOPAMIDOL (ISOVUE-300) INJECTION 61% COMPARISON:  Portable chest same date. FINDINGS: Cardiovascular: No acute vascular findings. There is atherosclerosis of the aorta, great vessels and coronary arteries peer no abnormality of the pulmonary arteries identified. The heart size is normal. There is no pericardial effusion. Mediastinum/Nodes: There are no enlarged mediastinal, hilar or axillary lymph nodes.No evidence of mediastinal hematoma. There is an ill-defined left thyroid nodule, best seen on coronal image 40, measuring 2.6 cm. Small hiatal hernia. Lungs/Pleura: There is a small dependent left pleural effusion. No pneumothorax. There is perihilar airspace disease in the left upper lobe surrounding several air-fluid levels, likely pulmonary contusion with cavitation. There is mild dependent atelectasis in both lungs. No pneumomediastinum. Upper abdomen: No acute findings are seen within the visualized upper abdomen. There are low-density lesions  in the upper pole of the right kidney and in the left hepatic lobe which are too small to characterize. Musculoskeletal/Chest wall: As demonstrated on the chest radiographs, there is a comminuted fracture involving the left scapular body, spine and base of the coracoid and acromion. No intra-articular extension into the glenohumeral joint is demonstrated. The humerus is located. The left clavicle appears intact. There is moderate surrounding hemorrhage within the trapezius, deltoid and rotator cuff musculature. There are multiple nondisplaced rib fractures on the left involving the costovertebral junctions, extending from the second through the seventh ribs. There are also nondisplaced fractures of the adjacent transverse processes on the left. There is probable involvement of the left-sided articulating facets at T3, T4 and T5. No vertebral body fracture or intraspinal fracture fragments are seen. IMPRESSION: 1. Multifocal left upper lobe pulmonary contusion with cavitation. 2. Left hemothorax.  No pneumothorax or pneumomediastinum. 3. No evidence of mediastinal hematoma or great vessel injury. 4. Comminuted fracture of the left scapula, involving the body, spine, coracoid and acromion. Associated adjacent intramuscular hematomas. 5. Multiple nondisplaced fractures involving the costovertebral junctions, transverse processes and articulating facets in the upper left chest. 6. Heterogeneous left thyroid nodule. Consider further evaluation with nonemergent thyroid ultrasound. This follows ACR consensus guidelines: Managing Incidental Thyroid Nodules Detected on Imaging: White Paper of the ACR Incidental Thyroid Findings Committee. J Am Coll Radiol 2015; 12:143-150. Electronically Signed: By: Richardean Sale M.D. On: 01/04/2017 13:49   Ct Cervical Spine Wo Contrast  Result  Date: 01/04/2017 CLINICAL DATA:  Head trauma after fall. EXAM: CT HEAD WITHOUT CONTRAST CT CERVICAL SPINE WITHOUT CONTRAST TECHNIQUE:  Multidetector CT imaging of the head and cervical spine was performed following the standard protocol without intravenous contrast. Multiplanar CT image reconstructions of the cervical spine were also generated. COMPARISON:  None. FINDINGS: CT HEAD FINDINGS Brain: No evidence of acute infarction, hemorrhage, hydrocephalus, extra-axial collection or mass lesion/mass effect. Vascular: Atherosclerotic vascular calcification of the carotid siphons. No hyperdense vessel. Skull: Normal. Negative for fracture or focal lesion. Sinuses/Orbits: Partial opacification of the left maxillary sinus with high-density, inspissated secretions, and thickening of the maxillary sinus walls, consistent with chronic sinusitis. Mild mucosal thickening in the bilateral frontal sinuses, right maxillary sinus, and left greater than right ethmoid air cells. The orbits are unremarkable. Other: None. CT CERVICAL SPINE FINDINGS Alignment: Reversal of the normal cervical lordosis. No traumatic malalignment. Skull base and vertebrae: No acute fracture. No primary bone lesion or focal pathologic process. Soft tissues and spinal canal: No prevertebral fluid or swelling. No visible canal hematoma. Disc levels: Mild-to-moderate multilevel degenerative disc disease and facet uncovertebral hypertrophy throughout the cervical spine. Severe right neuroforaminal stenosis at C4-C5. Upper chest: Partially visualized left second and third posterior rib fractures. Please see separate CT chest report from same day for further details. Other: None. IMPRESSION: 1.  No acute intracranial abnormality. 2.  No acute cervical spine fracture. 3. Partially visualized left second and third posterior rib fractures. Please see separate CT chest report from same day for further details. Electronically Signed   By: Titus Dubin M.D.   On: 01/04/2017 13:36   Ct Abdomen Pelvis W Contrast  Result Date: 01/05/2017 CLINICAL DATA:  Blunt abdominal trauma 1 day prior with  decreasing hemoglobin level. EXAM: CT ABDOMEN AND PELVIS WITH CONTRAST TECHNIQUE: Multidetector CT imaging of the abdomen and pelvis was performed using the standard protocol following bolus administration of intravenous contrast. CONTRAST:  152mL ISOVUE-300 IOPAMIDOL (ISOVUE-300) INJECTION 61% COMPARISON:  01/04/2017 chest CT. FINDINGS: Lower chest: Stable small dependent left hemothorax. Moderate dependent left lower lobe atelectasis. Mild dependent right lower lobe atelectasis. Hepatobiliary: Normal liver size. Simple 1.5 cm left liver lobe cyst. No additional liver lesions. No liver laceration. Vicarious excretion of contrast in the gallbladder with no gallbladder wall thickening or pericholecystic fluid . No biliary ductal dilatation. Pancreas: Normal, with no mass or duct dilation. Spleen: Normal size spleen.  No splenic mass or laceration. Adrenals/Urinary Tract: Normal adrenals. Nonobstructing 3 mm interpolar stones in both kidneys. No hydronephrosis. Small simple upper right renal cysts, largest 1.3 cm. Simple 1.7 cm anterior interpolar left renal cyst. Hypodense 1.4 cm inferior right renal lesion (series 8/ image 33) with density 47 HU. Additional subcentimeter hypodense renal cortical lesions scattered in both kidneys, too small to characterize. No hydronephrosis. No renal laceration. Normal caliber ureters. Normal bladder containing excreted contrast. Stomach/Bowel: Grossly normal stomach. Normal caliber small bowel with no small bowel wall thickening. Normal appendix. Mild sigmoid diverticulosis, with no large bowel wall thickening or pericolonic fat stranding. Vascular/Lymphatic: Atherosclerotic nonaneurysmal abdominal aorta. Patent portal, splenic, hepatic and renal veins. No pathologically enlarged lymph nodes in the abdomen or pelvis. Reproductive: Moderately enlarged prostate with nonspecific internal prostatic calcification. Other: No pneumoperitoneum, ascites or focal fluid collection. No  retroperitoneal hematoma. Musculoskeletal: No aggressive appearing focal osseous lesions. Moderate lumbar spondylosis. No evidence of abdominopelvic fracture. Stable mild superior T11 vertebral compression fracture. IMPRESSION: 1. No acute traumatic injury in the abdomen or pelvis. No hemoperitoneum. No  retroperitoneal hematoma. 2. Stable mild superior T11 vertebral compression fracture. 3. Stable small dependent left hemothorax with bibasilar atelectasis, left greater than right. 4. Indeterminate 1.4 cm hypodense renal cortical lesion in the lower right kidney, renal cell carcinoma not excluded. Recommend short-term outpatient characterization with MRI abdomen without and with IV contrast. 5. Nonobstructing bilateral renal stones.  No hydronephrosis. 6. Mild sigmoid diverticulosis. 7. Moderate prostatomegaly . 8.  Aortic Atherosclerosis (ICD10-I70.0). Electronically Signed   By: Ilona Sorrel M.D.   On: 01/05/2017 16:19   Dg Chest Port 1 View  Result Date: 01/06/2017 CLINICAL DATA:  Left hemothorax EXAM: PORTABLE CHEST 1 VIEW COMPARISON:  Chest radiograph dated 01/05/2017. CT chest dated 01/04/2017. FINDINGS: Persistent left upper lobe opacity, corresponding to known pulmonary contusion. Small left pleural effusion/ hemothorax.  No pneumothorax. Right lung is clear. The heart is normal in size. Comminuted left scapular fracture, better visualized on prior CT. IMPRESSION: Persistent left upper lobe opacity, corresponding to known pulmonary contusion. Small left pleural effusion/hemothorax. Comminuted left scapular fracture. Electronically Signed   By: Julian Hy M.D.   On: 01/06/2017 07:37   Dg Chest Port 1 View  Result Date: 01/05/2017 CLINICAL DATA:  Pneumothorax. Pulmonary contusion. Left rib and scapula fractures. T11 vertebral body fracture. EXAM: PORTABLE CHEST 1 VIEW 10:57 a.m. COMPARISON:  Chest x-rays dated 01/05/2017, 6:21 a.m., and 01/04/2017 and chest CT dated 01/04/2017 FINDINGS: Left  perihilar haziness has improved consistent with improving pulmonary contusion or aspiration pneumonitis. Persistent atelectasis/ consolidation at the left lung base posterior medially with a small left effusion. No pneumothorax. Again noted is the comminuted left scapular fracture. Right lung is clear.  Heart size and vascularity are normal. IMPRESSION: 1. Improving left perihilar haziness. 2. Persistent atelectasis/consolidation at the left lung base with a small left effusion. 3. Subtle compression fracture of the superior endplate of R48 is not appreciable on this chest x-ray. Electronically Signed   By: Lorriane Shire M.D.   On: 01/05/2017 11:22   Dg Chest Port 1 View  Result Date: 01/05/2017 CLINICAL DATA:  Multiple left-sided rib fractures after being struck by a tree branch yesterday. EXAM: PORTABLE CHEST 1 VIEW COMPARISON:  Portable chest x-ray and chest CT scan of January 04, 2017 FINDINGS: The lungs are adequately inflated. There small bilateral pleural effusions. There is no pneumothorax. Increased density in the left mid lung is present today. The cardiac silhouette is enlarged. The pulmonary vascularity is not engorged. The trachea is midline. Previously demonstrated scapular fractures are again demonstrated. Known multiple posteromedial left rib fractures are faintly visible. IMPRESSION: No pneumothorax. Small bilateral pleural effusions. Increased parenchymal density on the left may reflect a pulmonary contusion or pulmonary hemorrhage. Certainly pneumonia could produce similar findings. No CHF. Multiple left rib and scapular fractures. Electronically Signed   By: David  Martinique M.D.   On: 01/05/2017 08:01   Dg Chest Port 1 View  Result Date: 01/04/2017 CLINICAL DATA:  Hit in the back by a tree limb EXAM: PORTABLE CHEST 1 VIEW COMPARISON:  None. FINDINGS: Low volume film. Right lung clear. Hazy opacity overlying the left lung may be related to lung contusion. No evidence for pneumothorax or  substantial pleural effusion. The cardio pericardial silhouette is enlarged. Complex fracture noted left scapula. Telemetry leads overlie the chest. IMPRESSION: 1. Complex left scapular fracture. 2. Decreased volume left hemithorax with haziness over the left lung. Atelectasis or lung contusion could have this appearance. Electronically Signed   By: Misty Stanley M.D.   On: 01/04/2017  11:33    Anti-infectives: Anti-infectives    None       Assessment/Plan Patient struck by tree limb Multiple Left posterior rib FX - with involvement of CV junctions, TP, and articulated facets.pain control, IS/pulm toilet Left HTX -repeat CXR with improving hemothorax and no PTX Left pulmonary contusion with cavitation  Comminuted left scapula fracture - non-op management , f/u with Dr. Lorin Mercy in 2 weeks - will need PT when he has some scapular healing Thoracic transverse process fractures, left  AKI- Cr trending down, continue to monitor Heterogenous left thyroid nodule - incidental finding on CT, outpatient US/workup at hospital discharge  ABL Anemia - Hgb now 10.0, stabilizing; continue to monitor - CT abdomen/pelvis negative Congestion/Nasal Drainage - per patient request will order afrin nasal spray  FEN: decrease IVF, regular diet; increased tramadol to 100 mg q6h prn, changed robaxin to PO 750 mg q8h prn ID: None VTE: SCD's; hold lovenox, if Hgb remains stable can start tomorrow  Dispo: Transfer to floor. Repeat CXR in AM, labs. Mobilize. Likely home in the next 24-48 hrs.  LOS: 2 days    Brigid Re , [facility] 01/06/2017, 8:22 AM Pager: [phone] Trauma Pager: [phone] Mon-Fri 7:00 am-4:30 pm Sat-Sun 7:00 am-11:30 am

## 2017-01-06 NOTE — Progress Notes (Signed)
Patient transferred to [facility] room 11 per MD orders

## 2017-01-06 NOTE — Progress Notes (Signed)
Physical Therapy Treatment Patient Details Name: Joshua Durham MRN: [id number] DOB: January 23, 1951 Today's Date: 01/06/2017    History of Present Illness Pt is a 66 y.o. male who presented to the ED after a large tree limb fell on his back. Found to have multiple L posterior rib fractures, L hemothorax, L pulmonary contusion wtih cavitation, L thoracic transverse process fractures, and L scapular fracture. PMH significant for hypertension.     PT Comments    Pt continues to be limited in his mobility by pain but is making progress towards his goals. Pt currently min guard for safety with bed mobility, transfers and ambulation of 50 feet without an assistive device. Pt requires skilled PT while in the acute setting to progress mobility and improve strength and endurance to safely navigate in his home at discharge.   Follow Up Recommendations  No PT follow up;Supervision for mobility/OOB     Equipment Recommendations  None recommended by PT    Recommendations for Other Services       Precautions / Restrictions Precautions Precautions: Shoulder Type of Shoulder Precautions: No shoulder movement per Dr. Lorin Mercy Shoulder Interventions: Shoulder sling/immobilizer Restrictions Weight Bearing Restrictions: Yes LUE Weight Bearing: Non weight bearing    Mobility  Bed Mobility Overal bed mobility: Needs Assistance Bed Mobility: Rolling;Sidelying to Sit;Sit to Sidelying Rolling: Min guard Sidelying to sit: Min assist;HOB elevated       General bed mobility comments: Assist for trunk elevation to full upright sitting. Heavy reliance on bed rail  Transfers Overall transfer level: Needs assistance Equipment used: None Transfers: Sit to/from Stand Sit to Stand: Min guard         General transfer comment: min guard for safety, able to steady in standing  Ambulation/Gait Ambulation/Gait assistance: Min guard Ambulation Distance (Feet): 50 Feet Assistive device: None Gait  Pattern/deviations: Step-through pattern;Decreased step length - right;Decreased step length - left Gait velocity: slowed Gait velocity interpretation: Below normal speed for age/gender General Gait Details: hands on min guard for safety, no LoB, no buckling, decreased cadence with increased diaphoresis, and pallor with distance secondary to pain       Balance Overall balance assessment: No apparent balance deficits (not formally assessed)                                          Cognition Arousal/Alertness: Awake/alert Behavior During Therapy: WFL for tasks assessed/performed Overall Cognitive Status: Within Functional Limits for tasks assessed                                               Pertinent Vitals/Pain Pain Assessment: 0-10 Pain Score: 8  Pain Location: L ribs, L shoulder, back Pain Descriptors / Indicators: Aching;Discomfort;Guarding;Sore Pain Intervention(s): Premedicated before session;Monitored during session;Limited activity within patient's tolerance           PT Goals (current goals can now be found in the care plan section) Acute Rehab PT Goals Patient Stated Goal: less pain PT Goal Formulation: With patient Time For Goal Achievement: 01/12/17 Potential to Achieve Goals: Good Progress towards PT goals: Progressing toward goals    Frequency    Min 3X/week      PT Plan Current plan remains appropriate       AM-PAC PT "6 Clicks"  Daily Activity  Outcome Measure  Difficulty turning over in bed (including adjusting bedclothes, sheets and blankets)?: Unable Difficulty moving from lying on back to sitting on the side of the bed? : Unable Difficulty sitting down on and standing up from a chair with arms (e.g., wheelchair, bedside commode, etc,.)?: A Little Help needed moving to and from a bed to chair (including a wheelchair)?: A Little Help needed walking in hospital room?: A Little Help needed climbing 3-5 steps  with a railing? : A Lot 6 Click Score: 13    End of Session Equipment Utilized During Treatment: Gait belt Activity Tolerance: Patient limited by pain Patient left: in chair;with call bell/phone within reach;with family/visitor present Nurse Communication: Mobility status PT Visit Diagnosis: Other abnormalities of gait and mobility (R26.89);Pain;Dizziness and giddiness (R42) Pain - Right/Left: Left Pain - part of body: Shoulder     Time: 0156-1537 PT Time Calculation (min) (ACUTE ONLY): 32 min  Charges:  $Gait Training: 8-22 mins $Therapeutic Activity: 8-22 mins                    G Codes:       Skylan Gift B. Migdalia Dk PT, DPT Acute Rehabilitation  [phone] Pager [phone]     [place] 01/06/2017, 5:56 PM

## 2017-01-06 NOTE — Progress Notes (Signed)
Occupational Therapy Treatment Patient Details Name: Joshua Durham MRN: [id number] DOB: Nov 01, 1950 Today's Date: 01/06/2017    History of present illness Pt is a 66 y.o. male who presented to the ED after a large tree limb fell on his back. Found to have multiple L posterior rib fractures, L hemothorax, L pulmonary contusion wtih cavitation, L thoracic transverse process fractures, and L scapular fracture. PMH significant for hypertension.    OT comments  Pt tolerated L elbow/wrist/hand exercises x10 each this session. Educated pt on shoulder precautions and sling management. Pt requires max assist for donning/doffing sling. Pt able to perform functional mobility today with min guard assist. D/c plan remains appropriate. Will continue to follow acutely.   Follow Up Recommendations  Supervision/Assistance - 24 hour (Follow up with outpatient OT once cleared by MD)    Equipment Recommendations  3 in 1 bedside commode;Tub/shower seat    Recommendations for Other Services      Precautions / Restrictions Precautions Precautions: Shoulder Type of Shoulder Precautions: No shoulder movement per Dr. Lorin Mercy Shoulder Interventions: Shoulder sling/immobilizer Restrictions Weight Bearing Restrictions: Yes LUE Weight Bearing: Non weight bearing       Mobility Bed Mobility Overal bed mobility: Needs Assistance Bed Mobility: Rolling;Sidelying to Sit;Sit to Sidelying Rolling: Min guard Sidelying to sit: Min assist     Sit to sidelying: Min guard General bed mobility comments: Assist for trunk elevation to full upright sitting. Heavy reliance on bed rail  Transfers Overall transfer level: Needs assistance Equipment used: None Transfers: Sit to/from Stand Sit to Stand: Min guard         General transfer comment: for safety, no major unsteadiness or LOB noted    Balance Overall balance assessment: No apparent balance deficits (not formally assessed)                                         ADL either performed or assessed with clinical judgement   ADL Overall ADL's : Needs assistance/impaired                 Upper Body Dressing : Maximal assistance;Sitting Upper Body Dressing Details (indicate cue type and reason): for sling management. Educated on proper positioning and mangament of sling     Toilet Transfer: Min guard;Ambulation Toilet Transfer Details (indicate cue type and reason): Simulated by sit to stand from EOB with funcitonal mobility in room         Functional mobility during ADLs: Min guard       Vision       Perception     Praxis      Cognition Arousal/Alertness: Awake/alert Behavior During Therapy: WFL for tasks assessed/performed Overall Cognitive Status: Within Functional Limits for tasks assessed                                          Exercises Exercises: General Upper Extremity General Exercises - Upper Extremity Elbow Flexion: AROM;Left;10 reps;Seated Elbow Extension: AROM;Left;10 reps;Seated Wrist Flexion: AROM;Left;10 reps;Seated Wrist Extension: AROM;Left;10 reps;Seated Digit Composite Flexion: AROM;Left;10 reps;Seated Composite Extension: AROM;Left;10 reps;Seated   Shoulder Instructions       General Comments      Pertinent Vitals/ Pain       Pain Assessment: Faces Faces Pain Scale: Hurts even more Pain Location: L ribs,  L shoulder, back Pain Descriptors / Indicators: Aching;Discomfort;Guarding;Sore Pain Intervention(s): Limited activity within patient's tolerance;Monitored during session;Repositioned  Home Living                                          Prior Functioning/Environment              Frequency  Min 2X/week        Progress Toward Goals  OT Goals(current goals can now be found in the care plan section)  Progress towards OT goals: Progressing toward goals  Acute Rehab OT Goals Patient Stated Goal: less pain OT Goal  Formulation: With patient  Plan Discharge plan remains appropriate    Co-evaluation                 AM-PAC PT "6 Clicks" Daily Activity     Outcome Measure   Help from another person eating meals?: A Little Help from another person taking care of personal grooming?: A Little Help from another person toileting, which includes using toliet, bedpan, or urinal?: A Lot Help from another person bathing (including washing, rinsing, drying)?: A Lot Help from another person to put on and taking off regular upper body clothing?: A Lot Help from another person to put on and taking off regular lower body clothing?: A Lot 6 Click Score: 14    End of Session Equipment Utilized During Treatment: Other (comment) (L shoulder sling)  OT Visit Diagnosis: Other abnormalities of gait and mobility (R26.89);Pain Pain - Right/Left: Left Pain - part of body: Shoulder   Activity Tolerance Patient tolerated treatment well   Patient Left in bed;with call bell/phone within reach   Nurse Communication          Time: 5366-4403 OT Time Calculation (min): 14 min  Charges: OT General Charges $OT Visit: 1 Visit OT Treatments $Therapeutic Exercise: 8-22 mins  Zayven Powe A. Ulice Brilliant, M.S., OTR/L Pager: [place] 01/06/2017, 12:07 PM

## 2017-01-07 ENCOUNTER — Inpatient Hospital Stay (HOSPITAL_COMMUNITY): Payer: Medicare Other

## 2017-01-07 LAB — CBC
HEMATOCRIT: 28.9 % — AB (ref 39.0–52.0)
Hemoglobin: 10 g/dL — ABNORMAL LOW (ref 13.0–17.0)
MCH: 29.9 pg (ref 26.0–34.0)
MCHC: 34.6 g/dL (ref 30.0–36.0)
MCV: 86.5 fL (ref 78.0–100.0)
PLATELETS: 182 10*3/uL (ref 150–400)
RBC: 3.34 MIL/uL — ABNORMAL LOW (ref 4.22–5.81)
RDW: 13.3 % (ref 11.5–15.5)
WBC: 7.6 10*3/uL (ref 4.0–10.5)

## 2017-01-07 LAB — BASIC METABOLIC PANEL
ANION GAP: 7 (ref 5–15)
BUN: 17 mg/dL (ref 6–20)
CALCIUM: 8.8 mg/dL — AB (ref 8.9–10.3)
CO2: 25 mmol/L (ref 22–32)
Chloride: 100 mmol/L — ABNORMAL LOW (ref 101–111)
Creatinine, Ser: 1.21 mg/dL (ref 0.61–1.24)
GFR calc Af Amer: >60 mL/min (ref >60)
GFR, EST NON AFRICAN AMERICAN: 60 mL/min — AB (ref >60)
GLUCOSE: 123 mg/dL — AB (ref 65–99)
Potassium: 3.6 mmol/L (ref 3.5–5.1)
Sodium: 132 mmol/L — ABNORMAL LOW (ref 135–145)

## 2017-01-07 MED ORDER — INFLUENZA VAC SPLIT HIGH-DOSE 0.5 ML IM SUSY
0.5000 mL | PREFILLED_SYRINGE | Freq: Once | INTRAMUSCULAR | Status: AC
  Administered 2017-01-07: 0.5 mL via INTRAMUSCULAR
  Filled 2017-01-07: qty 0.5

## 2017-01-07 MED ORDER — METHOCARBAMOL 750 MG PO TABS: 750.0000 mg | ORAL_TABLET | Freq: Three times a day (TID) | ORAL | 0 refills | Status: DC | PRN

## 2017-01-07 MED ORDER — OXYCODONE HCL 10 MG PO TABS: 5.0000 mg | ORAL_TABLET | ORAL | 0 refills | Status: DC | PRN

## 2017-01-07 MED ORDER — TRAMADOL HCL 50 MG PO TABS: 50.0000 mg | ORAL_TABLET | Freq: Four times a day (QID) | ORAL | 1 refills | Status: DC | PRN

## 2017-01-07 MED ORDER — INFLUENZA VAC SPLIT HIGH-DOSE 0.5 ML IM SUSY: 0.5000 mL | PREFILLED_SYRINGE | INTRAMUSCULAR | Status: DC

## 2017-01-07 NOTE — Care Management Note (Signed)
Case Management Note  Patient Details  Name: Joshua Durham MRN: [id number] Date of Birth: 09-Feb-1951  Subjective/Objective:     Pt is a 66 y.o. male who presented to the ED after a large tree limb fell on his back. Found to have multiple L posterior rib fractures, L hemothorax, L pulmonary contusion wtih cavitation, L thoracic transverse process fractures, and L scapular fracture.  PTA, pt independent, lives with spouse.                 Action/Plan: Pt medically stable for dc home today, 10/5.  PT/OT recommending no OP follow up, DME.  Referral to [facility] for DME needs.  3 in 1 and tub bench (requested by pt/wife) to be delivered to room prior to dc home.    Expected Discharge Date:  01/07/17               Expected Discharge Plan:  Home/Self Care  In-House Referral:     Discharge planning Services  CM Consult  Post Acute Care Choice:    Choice offered to:     DME Arranged:  3-N-1, Tub bench DME Agency:  [place]:  NA [place] Agency:     Status of Service:  Completed, signed off  If discussed at [place] of Stay Meetings, dates discussed:    Additional Comments:  Reinaldo Raddle, RN, BSN  Trauma/Neuro ICU Case Manager [phone]

## 2017-01-07 NOTE — Progress Notes (Signed)
Occupational Therapy Treatment Patient Details Name: Joshua Durham MRN: [id number] DOB: [birth year] Today's Date: 01/07/2017    History of present illness Pt is a 66 y.o. male who presented to the ED after a large tree limb fell on his back. Found to have multiple L posterior rib fractures, L hemothorax, L pulmonary contusion wtih cavitation, L thoracic transverse process fractures, and L scapular fracture. PMH significant for hypertension.    OT comments  Pt progressing towards acute OT goals. Focus of session was UB/LB dressing, sling management and review of education. Spoke with Ortho Tech staff to request size large sling. Spouse present throughout session. Pain level 9/10 during session, premedicated. Hoping that new sling size will help decrease pain level.    Follow Up Recommendations  Supervision/Assistance - 24 hour (Follow up with outpatient OT once cleared by MD)    Equipment Recommendations  3 in 1 bedside commode;Tub/shower bench    Recommendations for Other Services      Precautions / Restrictions Precautions Precautions: Shoulder Type of Shoulder Precautions: No shoulder movement per Dr. Lorin Mercy Shoulder Interventions: Shoulder sling/immobilizer Restrictions Weight Bearing Restrictions: Yes LUE Weight Bearing: Non weight bearing       Mobility Bed Mobility Overal bed mobility: Needs Assistance Bed Mobility: Rolling;Sidelying to Sit Rolling: Min guard Sidelying to sit: Min assist       General bed mobility comments: Assist for trunk elevation to full upright sitting. Heavy reliance on bed rail  Transfers Overall transfer level: Needs assistance Equipment used: None Transfers: Sit to/from Stand Sit to Stand: Min guard         General transfer comment: from EOB elevated and to recliner.     Balance                                           ADL either performed or assessed with clinical judgement   ADL Overall ADL's : Needs  assistance/impaired                 Upper Body Dressing : Maximal assistance;Sitting Upper Body Dressing Details (indicate cue type and reason): Pt held arm in position to for UB dressing and sling managment. Lower Body Dressing: Moderate assistance;Sit to/from stand Lower Body Dressing Details (indicate cue type and reason): assist to don pants, stood min guard, assist to complete donning on L hip             Functional mobility during ADLs: Min guard General ADL Comments: Pt completed bed mobility, UB/LB dressing and in-room functional mobility. Discussed ADL and transfer strategies.      Vision       Perception     Praxis      Cognition Arousal/Alertness: Awake/alert Behavior During Therapy: WFL for tasks assessed/performed Overall Cognitive Status: Within Functional Limits for tasks assessed                                          Exercises Exercises: General Upper Extremity General Exercises - Upper Extremity Elbow Flexion: AROM;Left;5 reps;Supine Elbow Extension: AROM;Left;Seated;5 reps Wrist Flexion: AROM;Left;Seated;5 reps Wrist Extension: AROM;Left;Seated;5 reps Digit Composite Flexion: AROM;Left;Seated;5 reps Composite Extension: AROM;Left;Seated;5 reps   Shoulder Instructions       General Comments      Pertinent Vitals/ Pain  Pain Assessment: 0-10 Pain Score: 9  Pain Location: L ribs, L shoulder, back Pain Descriptors / Indicators: Aching;Discomfort;Guarding;Sore Pain Intervention(s): Limited activity within patient's tolerance;Monitored during session;Premedicated before session;Repositioned  Home Living                                          Prior Functioning/Environment              Frequency  Min 2X/week        Progress Toward Goals  OT Goals(current goals can now be found in the care plan section)  Progress towards OT goals: Progressing toward goals  Acute Rehab OT  Goals Patient Stated Goal: less pain OT Goal Formulation: With patient Time For Goal Achievement: 01/19/17 Potential to Achieve Goals: Good ADL Goals Pt Will Perform Grooming: with supervision;sitting Pt Will Perform Upper Body Dressing: with min assist;sitting Pt Will Perform Lower Body Dressing: with min assist;sit to/from stand Pt Will Transfer to Toilet: with supervision;ambulating Pt Will Perform Toileting - Clothing Manipulation and hygiene: with min assist;sit to/from stand Pt/caregiver will Perform Home Exercise Program: With Supervision;Left upper extremity;With written HEP provided  Plan Equipment recommendations need to be updated    Co-evaluation                 AM-PAC PT "6 Clicks" Daily Activity     Outcome Measure   Help from another person eating meals?: A Little Help from another person taking care of personal grooming?: A Little Help from another person toileting, which includes using toliet, bedpan, or urinal?: A Lot Help from another person bathing (including washing, rinsing, drying)?: A Lot Help from another person to put on and taking off regular upper body clothing?: A Lot Help from another person to put on and taking off regular lower body clothing?: A Lot 6 Click Score: 14    End of Session Equipment Utilized During Treatment: Other (comment) (L shoulder sling)  OT Visit Diagnosis: Other abnormalities of gait and mobility (R26.89);Pain Pain - Right/Left: Left Pain - part of body: Shoulder   Activity Tolerance Patient limited by pain;Patient tolerated treatment well   Patient Left in chair;with call bell/phone within reach;with family/visitor present   Nurse Communication Other (comment) (have requested smaller sling)        Time: 5449-2010 OT Time Calculation (min): 23 min  Charges: OT General Charges $OT Visit: 1 Visit OT Treatments $Self Care/Home Management : 23-37 mins     Hortencia Pilar 01/07/2017, 11:14 AM

## 2017-01-07 NOTE — Progress Notes (Signed)
Orthopedic Tech Progress Note Patient Details:  Joshua Durham January 03, 1951 [id number]  Ortho Devices Type of Ortho Device: Arm sling Ortho Device/Splint Location: pt complained that the sling immobilizer did not support arm.  I fitted pt with a large arm sling on left arm and pt was satisfied with support.   Ortho Device/Splint Interventions: Application, Adjustment   Kristopher Oppenheim 01/07/2017, 11:32 AM

## 2017-01-07 NOTE — Progress Notes (Signed)
Pt discharged home in stable condition after going over discharge instructions with no concerns voiced. AVS and discharge scripts given before leaving unit. Home equipment also provided before discharge

## 2017-01-07 NOTE — Care Management Important Message (Signed)
Important Message  Patient Details  Name: Joshua Durham MRN: [id number] Date of Birth: May 04, 1950   Medicare Important Message Given:  Yes    Orbie Pyo 01/07/2017, 12:27 PM

## 2017-01-14 ENCOUNTER — Encounter (HOSPITAL_COMMUNITY): Payer: Self-pay | Admitting: Emergency Medicine

## 2017-01-14 ENCOUNTER — Emergency Department (HOSPITAL_COMMUNITY)
Admission: EM | Admit: 2017-01-14 | Discharge: 2017-01-14 | Disposition: A | Discharge status: Home / Self Care | Payer: Medicare Other | Attending: Emergency Medicine | Admitting: Emergency Medicine

## 2017-01-14 ENCOUNTER — Telehealth (INDEPENDENT_AMBULATORY_CARE_PROVIDER_SITE_OTHER): Payer: Self-pay | Admitting: Orthopaedic Surgery

## 2017-01-14 DIAGNOSIS — N390 Urinary tract infection, site not specified: Secondary | ICD-10-CM | POA: Diagnosis not present

## 2017-01-14 DIAGNOSIS — Z79899 Other long term (current) drug therapy: Secondary | ICD-10-CM | POA: Diagnosis not present

## 2017-01-14 DIAGNOSIS — I10 Essential (primary) hypertension: Secondary | ICD-10-CM | POA: Diagnosis not present

## 2017-01-14 DIAGNOSIS — R339 Retention of urine, unspecified: Secondary | ICD-10-CM | POA: Diagnosis not present

## 2017-01-14 DIAGNOSIS — Z7982 Long term (current) use of aspirin: Secondary | ICD-10-CM | POA: Insufficient documentation

## 2017-01-14 DIAGNOSIS — R1030 Lower abdominal pain, unspecified: Secondary | ICD-10-CM | POA: Diagnosis present

## 2017-01-14 LAB — CBC WITH DIFFERENTIAL/PLATELET
Basophils Absolute: 0 10*3/uL (ref 0.0–0.1)
Basophils Relative: 0 %
Eosinophils Absolute: 0.3 10*3/uL (ref 0.0–0.7)
Eosinophils Relative: 3 %
HEMATOCRIT: 34.6 % — AB (ref 39.0–52.0)
HEMOGLOBIN: 11.5 g/dL — AB (ref 13.0–17.0)
LYMPHS ABS: 1.4 10*3/uL (ref 0.7–4.0)
LYMPHS PCT: 13 %
MCH: 28.3 pg (ref 26.0–34.0)
MCHC: 33.2 g/dL (ref 30.0–36.0)
MCV: 85 fL (ref 78.0–100.0)
MONOS PCT: 6 %
Monocytes Absolute: 0.6 10*3/uL (ref 0.1–1.0)
NEUTROS ABS: 8.4 10*3/uL — AB (ref 1.7–7.7)
Neutrophils Relative %: 78 %
Platelets: 401 10*3/uL — ABNORMAL HIGH (ref 150–400)
RBC: 4.07 MIL/uL — ABNORMAL LOW (ref 4.22–5.81)
RDW: 12.9 % (ref 11.5–15.5)
WBC: 10.7 10*3/uL — ABNORMAL HIGH (ref 4.0–10.5)

## 2017-01-14 LAB — BASIC METABOLIC PANEL
Anion gap: 10 (ref 5–15)
BUN: 22 mg/dL — AB (ref 6–20)
CHLORIDE: 96 mmol/L — AB (ref 101–111)
CO2: 24 mmol/L (ref 22–32)
CREATININE: 1.31 mg/dL — AB (ref 0.61–1.24)
Calcium: 9.5 mg/dL (ref 8.9–10.3)
GFR calc Af Amer: >60 mL/min (ref >60)
GFR calc non Af Amer: 54 mL/min — ABNORMAL LOW (ref >60)
Glucose, Bld: 122 mg/dL — ABNORMAL HIGH (ref 65–99)
POTASSIUM: 4.2 mmol/L (ref 3.5–5.1)
Sodium: 130 mmol/L — ABNORMAL LOW (ref 135–145)

## 2017-01-14 LAB — URINALYSIS, ROUTINE W REFLEX MICROSCOPIC
BACTERIA UA: NONE SEEN
BILIRUBIN URINE: NEGATIVE
Glucose, UA: NEGATIVE mg/dL
Hgb urine dipstick: NEGATIVE
Ketones, ur: NEGATIVE mg/dL
Leukocytes, UA: NEGATIVE
Nitrite: POSITIVE — AB
PH: 7 (ref 5.0–8.0)
Protein, ur: NEGATIVE mg/dL
SPECIFIC GRAVITY, URINE: 1.01 (ref 1.005–1.030)
SQUAMOUS EPITHELIAL / LPF: NONE SEEN

## 2017-01-14 MED ORDER — CEPHALEXIN 500 MG PO CAPS: 500.0000 mg | ORAL_CAPSULE | Freq: Three times a day (TID) | ORAL | 0 refills | Status: DC

## 2017-01-14 MED ORDER — SODIUM CHLORIDE 0.9 % IV SOLN
INTRAVENOUS | Status: DC
  Administered 2017-01-14: 20 mL/h via INTRAVENOUS

## 2017-01-14 NOTE — Telephone Encounter (Signed)
Letter of medical necessity written and faxed to [facility]. Gwinda Passe will fax rx [facility] [phone]
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
## 2017-01-14 NOTE — Telephone Encounter (Signed)
OK with me. ucall thanks

## 2017-01-14 NOTE — Telephone Encounter (Signed)
Patient's wife Arville Go wants to inquire about maybe getting the patient a hospital bed for home.

## 2017-01-14 NOTE — ED Provider Notes (Signed)
[place] DEPT Provider Note   CSN: [id number] Arrival date & time: 01/14/17  0111     History   Chief Complaint Chief Complaint  Patient presents with  . Abdominal Pain    HPI Joshua Durham is a 66 y.o. male.  66 year old male presents with three-day history of urinary frequency as well as urgency as well as some suprapubic discomfort. Denies any fever, flank pain. Does have a history of BPH and takes Cialis for that. Denies any dysuria. Denies starting any new anticholinergics. Symptoms have been persistent and worse with attempted to urinate. No treatment used for this prior to arrival.      Past Medical History:  Diagnosis Date  . Hypertension     Patient Active Problem List   Diagnosis Date Noted  . Left scapula fracture 01/04/2017    History reviewed. No pertinent surgical history.     Home Medications    Prior to Admission medications   Medication Sig Start Date End Date Taking? Authorizing Provider  acetaminophen (TYLENOL) 650 MG CR tablet Take 650-1,300 mg by mouth every 8 (eight) hours as needed for pain.     [provider]  aspirin EC 81 MG tablet Take 81 mg by mouth at bedtime.    [provider]  atenolol (TENORMIN) 50 MG tablet Take 50 mg by mouth daily. 11/08/16   [provider]  chlorthalidone (HYGROTON) 25 MG tablet Take 25 mg by mouth daily with breakfast. 11/08/16   [provider]  Choline Fenofibrate (FENOFIBRIC ACID) 135 MG CPDR Take 135 mg by mouth daily. 11/11/16   [provider]  COLCRYS 0.6 MG tablet Take 0.6 mg by mouth 3 (three) times daily as needed (for gout flares).  11/23/16   [provider]  lisinopril (PRINIVIL,ZESTRIL) 5 MG tablet Take 5 mg by mouth at bedtime. 11/03/16   [provider]  methocarbamol (ROBAXIN) 750 MG tablet Take 1 tablet (750 mg total) by mouth every 8 (eight) hours as needed for muscle spasms. 01/07/17   Rayburn, Floyce Stakes, PA-C  niacin (NIASPAN) 1000  MG CR tablet Take 1,000 mg by mouth at bedtime.  10/20/16   [provider]  Omega-3 Fatty Acids (FISH OIL) 1000 MG CAPS Take 1,000-2,000 mg by mouth See admin instructions. 2,000 mg in the morning and 1,000 mg in the evening    [provider]  oxyCODONE 10 MG TABS Take 0.5-1 tablets (5-10 mg total) by mouth every 4 (four) hours as needed for severe pain. 01/07/17   Rayburn, Claiborne Billings A, PA-C  potassium chloride SA (K-DUR,KLOR-CON) 20 MEQ tablet Take 20 mEq by mouth daily with breakfast.    [provider]  simvastatin (ZOCOR) 40 MG tablet Take 40 mg by mouth at bedtime.    [provider]  traMADol (ULTRAM) 50 MG tablet Take 1-2 tablets (50-100 mg total) by mouth every 6 (six) hours as needed. 01/07/17   Rayburn, Floyce Stakes, PA-C    Family History No family history on file.  Social History Social History  Substance Use Topics  . Smoking status: Never Smoker  . Smokeless tobacco: Never Used  . Alcohol use No     Allergies   Patient has no known allergies.   Review of Systems Review of Systems  All other systems reviewed and are negative.    Physical Exam Updated Vital Signs BP (!) 147/72   Pulse 68   Temp 98.2 F (36.8 C) (Oral)   Resp 17   Ht  1.829 m (6')   Wt 99.8 kg (220 lb)   SpO2 97%   BMI 29.84 kg/m   Physical Exam  Constitutional: He is oriented to person, place, and time. He appears well-developed and well-nourished.  Non-toxic appearance. No distress.  HENT:  Head: Normocephalic and atraumatic.  Eyes: Pupils are equal, round, and reactive to light. Conjunctivae, EOM and lids are normal.  Neck: Normal range of motion. Neck supple. No tracheal deviation present. No thyroid mass present.  Cardiovascular: Normal rate, regular rhythm and normal heart sounds.  Exam reveals no gallop.   No murmur heard. Pulmonary/Chest: Effort normal and breath sounds normal. No stridor. No respiratory distress. He has no decreased breath sounds. He has  no wheezes. He has no rhonchi. He has no rales.  Abdominal: Soft. Normal appearance and bowel sounds are normal. He exhibits no distension. There is tenderness in the suprapubic area. There is no rebound and no CVA tenderness.    Musculoskeletal: Normal range of motion. He exhibits no edema or tenderness.  Neurological: He is alert and oriented to person, place, and time. He has normal strength. No cranial nerve deficit or sensory deficit. GCS eye subscore is 4. GCS verbal subscore is 5. GCS motor subscore is 6.  Skin: Skin is warm and dry. No abrasion and no rash noted.  Psychiatric: He has a normal mood and affect. His speech is normal and behavior is normal.  Nursing note and vitals reviewed.    ED Treatments / Results  Labs (all labs ordered are listed, but only abnormal results are displayed) Labs Reviewed  URINALYSIS, ROUTINE W REFLEX MICROSCOPIC - Abnormal; Notable for the following:       Result Value   Color, Urine AMBER (*)    Nitrite POSITIVE (*)    All other components within normal limits  CBC WITH DIFFERENTIAL/PLATELET  BASIC METABOLIC PANEL    EKG  EKG Interpretation None       Radiology No results found.  Procedures Procedures (including critical care time)  Medications Ordered in ED Medications  0.9 %  sodium chloride infusion (not administered)     Initial Impression / Assessment and Plan / ED Course  I have reviewed the triage vital signs and the nursing notes.  Pertinent labs & imaging results that were available during my care of the patient were reviewed by me and considered in my medical decision making (see chart for details).     Patient's urinalysis with positive nitrite. Had over 1000 mL of urine in his bladder and Foley catheter placed by nursing staff with good return and patient feels much better at this time. Creatinine is around his baseline. Will send patient home with a Foley catheter as well as a leg bag and he will follow-up with  his urologist.  Final Clinical Impressions(s) / ED Diagnoses   Final diagnoses:  None    New Prescriptions New Prescriptions   No medications on file     Lacretia Leigh, MD 01/14/17 [phone]

## 2017-01-14 NOTE — Telephone Encounter (Signed)
Faxed to [facility]. Left voicemail for patient wife advising.

## 2017-01-14 NOTE — Telephone Encounter (Signed)
Please advise 

## 2017-01-17 ENCOUNTER — Other Ambulatory Visit: Payer: Self-pay | Admitting: Physician Assistant

## 2017-01-17 ENCOUNTER — Ambulatory Visit
Admission: RE | Admit: 2017-01-17 | Discharge: 2017-01-17 | Disposition: A | Discharge status: Home / Self Care | Payer: Medicare Other | Source: Ambulatory Visit | Attending: Physician Assistant | Admitting: Physician Assistant

## 2017-01-17 DIAGNOSIS — J942 Hemothorax: Secondary | ICD-10-CM

## 2017-01-17 DIAGNOSIS — S42101A Fracture of unspecified part of scapula, right shoulder, initial encounter for closed fracture: Secondary | ICD-10-CM | POA: Diagnosis not present

## 2017-01-18 DIAGNOSIS — J942 Hemothorax: Secondary | ICD-10-CM | POA: Diagnosis not present

## 2017-01-20 DIAGNOSIS — R338 Other retention of urine: Secondary | ICD-10-CM | POA: Diagnosis not present

## 2017-01-20 DIAGNOSIS — N401 Enlarged prostate with lower urinary tract symptoms: Secondary | ICD-10-CM | POA: Diagnosis not present

## 2017-01-24 DIAGNOSIS — R338 Other retention of urine: Secondary | ICD-10-CM | POA: Diagnosis not present

## 2017-01-25 ENCOUNTER — Ambulatory Visit (INDEPENDENT_AMBULATORY_CARE_PROVIDER_SITE_OTHER): Payer: Medicare Other | Admitting: Orthopaedic Surgery

## 2017-01-25 ENCOUNTER — Ambulatory Visit (INDEPENDENT_AMBULATORY_CARE_PROVIDER_SITE_OTHER): Payer: Medicare Other

## 2017-01-25 ENCOUNTER — Encounter (INDEPENDENT_AMBULATORY_CARE_PROVIDER_SITE_OTHER): Payer: Self-pay | Admitting: Orthopaedic Surgery

## 2017-01-25 VITALS — BP 117/68 | HR 97 | Ht 70.0 in | Wt 200.0 lb

## 2017-01-25 DIAGNOSIS — S42102A Fracture of unspecified part of scapula, left shoulder, initial encounter for closed fracture: Secondary | ICD-10-CM | POA: Diagnosis not present

## 2017-01-25 DIAGNOSIS — N139 Obstructive and reflux uropathy, unspecified: Secondary | ICD-10-CM | POA: Diagnosis not present

## 2017-01-25 DIAGNOSIS — R3914 Feeling of incomplete bladder emptying: Secondary | ICD-10-CM | POA: Diagnosis not present

## 2017-01-25 NOTE — Progress Notes (Signed)
   Office Visit Note   Patient: Joshua Durham           Date of Birth: February 15, 1951           MRN: [id number] Visit Date: 01/25/2017              Requested by: No referring provider defined for this encounter. PCP: Mayra Neer, MD   Assessment & Plan: Visit Diagnoses:  1. Closed fracture of left scapula, unspecified part of scapula, initial encounter     Plan: Recheck 3 weeks. No x-ray needed on return.  Follow-Up Instructions: No Follow-up on file.   Orders:  Orders Placed This Encounter  Procedures  . XR Scapula Left   No orders of the defined types were placed in this encounter.     Procedures: No procedures performed   Clinical Data: No additional findings.   Subjective: Chief Complaint  Patient presents with  . Left Shoulder - Fracture    HPI follow-up left comminuted scapular fracture. Residual injury was when a large tree limb fell on him on 01/04/2017. X-ray today shows comminuted scapular fracture is lung is inflated no pneumothorax. He's taking muscle relaxants and also tramadol.  Review of Systems review of systems updated and is unchanged from last office visit.   Objective: Vital Signs: BP 117/68   Pulse 97   Ht 5\' 10"  (1.778 m)   Wt 200 lb (90.7 kg)   BMI 28.70 kg/m   Physical Exam  Constitutional: He is oriented to person, place, and time. He appears well-developed and well-nourished.  HENT:  Head: Normocephalic and atraumatic.  Eyes: Pupils are equal, round, and reactive to light. EOM are normal.  Neck: No tracheal deviation present. No thyromegaly present.  Cardiovascular: Normal rate.   Pulmonary/Chest: Effort normal. He has no wheezes.  No dyspnea with breathing. Patient's and his sling.  Abdominal: Soft. Bowel sounds are normal.  Neurological: He is alert and oriented to person, place, and time.  Skin: Skin is warm and dry. Capillary refill takes less than 2 seconds.  Psychiatric: He has a normal mood and affect. His behavior  is normal. Judgment and thought content normal.    Ortho Exam station his hand is intact. Neurologically intact left upper extremity. He still using his sling  Specialty Comments:  No specialty comments available.  Imaging: Xr Scapula Left  Result Date: 01/25/2017 X-rays left scapular tendon reviewed. This shows comminuted scapular fracture with satisfactory position of the glenohumeral joint. Impression comminuted scapular fracture unchanged in position from 01/04/2017.    PMFS History: Patient Active Problem List   Diagnosis Date Noted  . Left scapula fracture 01/04/2017   Past Medical History:  Diagnosis Date  . Hypertension     No family history on file.  No past surgical history on file. Social History   Occupational History  . Not on file.   Social History Main Topics  . Smoking status: Never Smoker  . Smokeless tobacco: Never Used  . Alcohol use No  . Drug use: No  . Sexual activity: Not on file

## 2017-01-26 DIAGNOSIS — R338 Other retention of urine: Secondary | ICD-10-CM | POA: Diagnosis not present

## 2017-01-27 DIAGNOSIS — R338 Other retention of urine: Secondary | ICD-10-CM | POA: Diagnosis not present

## 2017-01-28 DIAGNOSIS — R3914 Feeling of incomplete bladder emptying: Secondary | ICD-10-CM | POA: Diagnosis not present

## 2017-01-28 DIAGNOSIS — R338 Other retention of urine: Secondary | ICD-10-CM | POA: Diagnosis not present

## 2017-02-03 DIAGNOSIS — R3914 Feeling of incomplete bladder emptying: Secondary | ICD-10-CM | POA: Diagnosis not present

## 2017-02-03 DIAGNOSIS — R338 Other retention of urine: Secondary | ICD-10-CM | POA: Diagnosis not present

## 2017-02-15 ENCOUNTER — Ambulatory Visit (INDEPENDENT_AMBULATORY_CARE_PROVIDER_SITE_OTHER): Payer: Medicare Other | Admitting: Orthopaedic Surgery

## 2017-02-15 ENCOUNTER — Encounter (INDEPENDENT_AMBULATORY_CARE_PROVIDER_SITE_OTHER): Payer: Self-pay | Admitting: Orthopaedic Surgery

## 2017-02-15 VITALS — BP 122/67 | HR 78

## 2017-02-15 DIAGNOSIS — S42102A Fracture of unspecified part of scapula, left shoulder, initial encounter for closed fracture: Secondary | ICD-10-CM
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
## 2017-02-16 DIAGNOSIS — N401 Enlarged prostate with lower urinary tract symptoms: Secondary | ICD-10-CM | POA: Diagnosis not present

## 2017-02-16 DIAGNOSIS — R3914 Feeling of incomplete bladder emptying: Secondary | ICD-10-CM | POA: Diagnosis not present

## 2017-03-01 ENCOUNTER — Ambulatory Visit (INDEPENDENT_AMBULATORY_CARE_PROVIDER_SITE_OTHER): Payer: Medicare Other

## 2017-03-01 ENCOUNTER — Ambulatory Visit (INDEPENDENT_AMBULATORY_CARE_PROVIDER_SITE_OTHER): Payer: Medicare Other | Admitting: Orthopaedic Surgery

## 2017-03-01 ENCOUNTER — Encounter (INDEPENDENT_AMBULATORY_CARE_PROVIDER_SITE_OTHER): Payer: Self-pay | Admitting: Orthopaedic Surgery

## 2017-03-01 DIAGNOSIS — S42102D Fracture of unspecified part of scapula, left shoulder, subsequent encounter for fracture with routine healing: Secondary | ICD-10-CM

## 2017-03-01 NOTE — Progress Notes (Signed)
   Office Visit Note   Patient: Joshua Durham           Date of Birth: 09/23/1950           MRN: [id number] Visit Date: 03/01/2017              Requested by: Mayra Neer, MD [address] [place], [place] [address] PCP: Mayra Neer, MD   Assessment & Plan: Visit Diagnoses:  1. Closed fracture of left scapula with routine healing, unspecified part of scapula, subsequent encounter     Plan: He can reach his hand to his forehead. He is using a pulley at home slowly. We'll check him back again in one month and then we can start some formal physical therapy. He had severely comminuted fracture and x-rays demonstrate some interval healing taking place now at 8 weeks post injury.  Follow-Up Instructions: No Follow-up on file.   Orders:  Orders Placed This Encounter  Procedures  . XR Scapula Left   No orders of the defined types were placed in this encounter.     Procedures: No procedures performed   Clinical Data: No additional findings.   Subjective: Chief Complaint  Patient presents with  . Left Shoulder - Follow-up    HPI follow-up multitrauma with rib fractures comminuted scapular fracture from a large tree limb fell on his back and shoulder. He still has slight swelling in the mid axillary region slightly in his arm and over the scapula. Sensation of his hand is intact.  Review of Systems Unchanged from last office visit.  Objective: Vital Signs: There were no vitals taken for this visit.  Physical Exam  Constitutional: He is oriented to person, place, and time. He appears well-developed and well-nourished.  HENT:  Head: Normocephalic and atraumatic.  Eyes: EOM are normal. Pupils are equal, round, and reactive to light.  Neck: No tracheal deviation present. No thyromegaly present.  Cardiovascular: Normal rate.  Pulmonary/Chest: Effort normal. He has no wheezes.  Abdominal: Soft. Bowel sounds are normal.  Neurological: He is alert and  oriented to person, place, and time.  Skin: Skin is warm and dry. Capillary refill takes less than 2 seconds.  Psychiatric: He has a normal mood and affect. His behavior is normal. Judgment and thought content normal.    Ortho Exam axillary sensation, median ulnar sensation radial sensation is intact. Coracoid is not the easily palpable. He can reach his hand to his nose into his forehead. He has some mild periscapular atrophy.  Specialty Comments:  No specialty comments available.  Imaging: No results found.   PMFS History: Patient Active Problem List   Diagnosis Date Noted  . Left scapula fracture 01/04/2017   Past Medical History:  Diagnosis Date  . Hypertension     No family history on file.  No past surgical history on file. Social History   Occupational History  . Not on file  Tobacco Use  . Smoking status: Never Smoker  . Smokeless tobacco: Never Used  Substance and Sexual Activity  . Alcohol use: No  . Drug use: No  . Sexual activity: Not on file

## 2017-04-06 ENCOUNTER — Ambulatory Visit (INDEPENDENT_AMBULATORY_CARE_PROVIDER_SITE_OTHER): Payer: Medicare Other | Admitting: Orthopaedic Surgery

## 2017-04-06 ENCOUNTER — Encounter (INDEPENDENT_AMBULATORY_CARE_PROVIDER_SITE_OTHER): Payer: Self-pay | Admitting: Orthopaedic Surgery

## 2017-04-06 ENCOUNTER — Ambulatory Visit (INDEPENDENT_AMBULATORY_CARE_PROVIDER_SITE_OTHER): Payer: Medicare Other

## 2017-04-06 VITALS — BP 129/69 | HR 83 | Ht 70.0 in | Wt 200.0 lb

## 2017-04-06 DIAGNOSIS — S42102A Fracture of unspecified part of scapula, left shoulder, initial encounter for closed fracture: Secondary | ICD-10-CM

## 2017-04-06 NOTE — Progress Notes (Signed)
Office Visit Note   Patient: Joshua Durham           Date of Birth: 07/12/1950           MRN: [id number] Visit Date: 04/06/2017              Requested by: Mayra Neer, MD [address] [place], [place] [address] PCP: Mayra Neer, MD   Assessment & Plan: Visit Diagnoses:  1. Closed fracture of left scapula, unspecified part of scapula, initial encounter     Plan: Patient can get his hand to his ear has difficulty reaching the top of his head.  We will set him up for some physical therapy for gentle range of motion.  Coracoid piece of the scapula is displaced from the body of the scapula.  Swelling in his hand has decreased.  Still has trouble sleeping on his side with mild discomfort.  Goal would be for him to be able to reach top of his head.  Office follow-up 1 month.  We reviewed the x-rays and discussed that the body of the scapula inferiorly displaced coracoid still has the coracoacromial ligaments attached to the clavicle.  Acromium is displaced from the body of the scapula.  Operative treatment as an option however he should be able to improve if he can get a few more inches the top of his head for activities of daily living.  He will avoid heavy lifting.  Recheck 1 month.  Follow-Up Instructions: Return in about 1 month (around 05/07/2017).   Orders:  Orders Placed This Encounter  Procedures  . XR Scapula Left   No orders of the defined types were placed in this encounter.     Procedures: No procedures performed   Clinical Data: No additional findings.   Subjective: Chief Complaint  Patient presents with  . scapula    f/u scapula fx-DOI 01/04/17    HPI 67 year old male returns following large tree limb injury to the posterior left scapula with left hemothorax, pulmonary contusion, severe comminuted left scapular fracture transverse process fractures on thoracic spine.  Been able to mobilize with a pulley can get his hand to his head.  Pain is  gradually improved.  He is off narcotic medication.  Review of Systems 14 point review of systems updated.  No shortness of breath.  Swelling in his hand on the left is resolved.  Otherwise negative as it pertains to HPI.   Objective: Vital Signs: BP 129/69   Pulse 83   Ht 5\' 10"  (1.778 m)   Wt 200 lb (90.7 kg)   BMI 28.70 kg/m   Physical Exam  Constitutional: He is oriented to person, place, and time. He appears well-developed and well-nourished.  HENT:  Head: Normocephalic and atraumatic.  Eyes: EOM are normal. Pupils are equal, round, and reactive to light.  Neck: No tracheal deviation present. No thyromegaly present.  Cardiovascular: Normal rate.  Pulmonary/Chest: Effort normal. He has no wheezes.  Abdominal: Soft. Bowel sounds are normal.  Neurological: He is alert and oriented to person, place, and time.  Skin: Skin is warm and dry. Capillary refill takes less than 2 seconds.  Psychiatric: He has a normal mood and affect. His behavior is normal. Judgment and thought content normal.    Ortho Exam patient can reach his hand to the posterior axillary line left.  Shoulder motion with hand to his ear but not to the top of his head.  Tenderness over the scapular body.  Sensation  to his hand grip is good sensation of the fingers are normal.  Specialty Comments:  No specialty comments available.  Imaging: No results found.   PMFS History: Patient Active Problem List   Diagnosis Date Noted  . Left scapula fracture 01/04/2017   Past Medical History:  Diagnosis Date  . Hypertension     No family history on file.  No past surgical history on file. Social History   Occupational History  . Not on file  Tobacco Use  . Smoking status: Never Smoker  . Smokeless tobacco: Never Used  Substance and Sexual Activity  . Alcohol use: No  . Drug use: No  . Sexual activity: Not on file

## 2017-04-12 ENCOUNTER — Ambulatory Visit: Payer: Medicare Other | Attending: Orthopaedic Surgery | Admitting: Physical Therapy

## 2017-04-12 DIAGNOSIS — R293 Abnormal posture: Secondary | ICD-10-CM | POA: Diagnosis not present

## 2017-04-12 DIAGNOSIS — M6281 Muscle weakness (generalized): Secondary | ICD-10-CM | POA: Diagnosis not present

## 2017-04-12 DIAGNOSIS — G8929 Other chronic pain: Secondary | ICD-10-CM | POA: Diagnosis not present

## 2017-04-12 DIAGNOSIS — M25512 Pain in left shoulder: Secondary | ICD-10-CM | POA: Insufficient documentation

## 2017-04-12 DIAGNOSIS — M25612 Stiffness of left shoulder, not elsewhere classified: Secondary | ICD-10-CM | POA: Diagnosis not present

## 2017-04-12 NOTE — Therapy (Signed)
[place], [place], [address] Phone: [phone]   Fax:  [phone]  Physical Therapy Evaluation  Patient Details  Name: Joshua Durham MRN: [id number] Date of Birth: Mar 02, 1951 Referring Provider: Dr. Rodell Perna   Encounter Date: 04/12/2017  PT End of Session - 04/12/17 1142    Visit Number  1    Number of Visits  16    Date for PT Re-Evaluation  06/07/17    Authorization Type  Medicare/BCBS    PT Start Time  1100    PT Stop Time  1136    PT Time Calculation (min)  36 min    Activity Tolerance  Patient tolerated treatment well    Behavior During Therapy  [facility] for tasks assessed/performed       Past Medical History:  Diagnosis Date  . Hypertension     History reviewed. No pertinent surgical history.  There were no vitals filed for this visit.   Subjective Assessment - 04/12/17 1104    Subjective  Pt is a 67 y/o male who presents to OPPT for Lt scapula fx on 01/04/17.  Pt reports he was trimming branches and a dead branch fell on his back resulting in Lt scapula fx, 7 rib fx and bruised lung.  Pt reports no difficulty with ribs, and c/o pain, feelings of instability, and decreased motion in Lt shoulder.  Pt reports he was doing pulleys at home, but that aggravated him and he has stopped recently.    Pertinent History  HTN    Limitations  House hold activities    Diagnostic tests  xray: Severely comminuted scapular fracture with remaining displacement at the base of the coracoid scapular body.  Fracture lines at the scapular body have healed..    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  Yes    Pain Score  0-No pain up to 4/10    Pain Location  Shoulder    Pain Orientation  Left    Pain Descriptors / Indicators  Dull;Sharp;Sore;Tightness sharp pain with movement    Pain Type  Chronic pain    Pain Onset  More than a month ago    Pain  Frequency  Intermittent    Aggravating Factors   movement of shoulder    Pain Relieving Factors  tylenol         [facility] PT Assessment - 04/12/17 1110      Assessment   Medical Diagnosis  Lt scapula fx    Referring Provider  Dr. Rodell Perna    Onset Date/Surgical Date  01/04/17    Hand Dominance  Right    Next MD Visit  05/24/17    Prior Therapy  none      Precautions   Precaution Comments  no heavy lifting      Restrictions   Weight Bearing Restrictions  No      Balance Screen   Has the patient fallen in the past 6 months  No    Has the patient had a decrease in activity level because of a fear of falling?   No    Is the patient reluctant to leave their home because of a fear of falling?   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    Leisure  travel, farming, house on [place]   Overall Cognitive Status  Within Functional Limits for tasks assessed      Observation/Other Assessments   Focus on Therapeutic Outcomes (FOTO)   53 (47% limited; predicted 31% limited)      Posture/Postural Control   Posture/Postural Control  Postural limitations    Postural Limitations  Rounded Shoulders;Forward head      ROM / Strength   AROM / PROM / Strength  AROM;PROM;Strength      AROM   AROM Assessment Site  Shoulder    Right/Left Shoulder  Left    Left Shoulder Flexion  60 Degrees    Left Shoulder ABduction  52 Degrees    Left Shoulder Internal Rotation  85 Degrees    Left Shoulder External Rotation  10 Degrees      PROM   Overall PROM Comments  firm end feels except IR    PROM Assessment Site   Shoulder    Right/Left Shoulder  Left    Left Shoulder Flexion  105 Degrees    Left Shoulder ABduction  60 Degrees    Left Shoulder Internal Rotation  90 Degrees    Left Shoulder External Rotation  15 Degrees      Strength   Overall Strength Comments  grossly Lt shoulder 3-/5 except extension 5/5; elbow flexion 5/5; elbow ext 4/5 with pain      Palpation   Palpation comment  some tenderness along pec major, and middle deltoid             Objective measurements completed on examination: See above findings.      [facility] Adult PT Treatment/Exercise - 04/12/17 1110      Exercises   Exercises  Shoulder      Shoulder Exercises: Supine   External Rotation  AAROM;Left;10 reps    External Rotation Limitations  cane    Flexion  AAROM;10 reps    Flexion Limitations  cane    ABduction  AAROM;Left;10 reps    ABduction Limitations  cane             PT Education - 04/12/17 1141    Education provided  Yes    Education Details  supine HEP    Person(s) Educated  Patient    Methods  Explanation;Demonstration;Handout    Comprehension  Verbalized understanding;Returned demonstration;Need further instruction       PT Short Term Goals - 04/12/17 1240      PT SHORT TERM GOAL #1   Title  independent with HEP    Status  New    Target Date  05/10/17      PT SHORT TERM GOAL #2   Title  improve Lt shoulder AROM flexion, abduction and er by 10 degrees all motions for improved function    Status  New    Target Date  05/10/17      PT SHORT TERM GOAL #3   Title  report pain < 2/10 with activity for improved function    Status  New    Target Date  05/10/17        PT  Long Term Goals - 04/12/17 1241      PT LONG TERM GOAL #1   Title  independent with advanced HEP    Status  New    Target Date  06/07/17      PT LONG TERM GOAL #2   Title  improve Lt shoulder AROM by at least 15 degrees for improved function    Status  New    Target Date  06/07/17      PT LONG TERM GOAL  #3   Title  report ability to perform ADLs without increase in pain for improved function    Status  New    Target Date  06/07/17             Plan - 04/12/17 1233    Clinical Impression Statement  Pt is a 67 y/o male who presents to OPPT for Lt scapula fx which occured on 01/04/17.  Pt demonstrates postural abnormalities, significantly decreased ROM and strength affecting functional mobility.  Given recent x-ray results feel improved ROM may be limited, but will plan to help pt become as functional as possible.  Will benefit from PT to address deficits listed.    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    Clinical Decision Making  Moderate    Rehab Potential  Fair    PT Frequency  2x / week    PT Duration  8 weeks    PT Treatment/Interventions  ADLs/Self Care Home Management;Cryotherapy;Electrical Stimulation;Moist Heat;Therapeutic exercise;Therapeutic activities;Functional mobility training;Patient/family education;Manual techniques;Vasopneumatic Device;Taping;Dry needling;Passive range of motion    PT Next Visit Plan  PROM and AAROM for Lt shoulder, isometrics?; progress to active as able    Consulted and Agree with Plan of Care  Patient       Patient will benefit from skilled therapeutic intervention in order to improve the following deficits and impairments:  Postural dysfunction, Decreased range of motion, Decreased strength, Pain, Impaired UE functional use, Increased muscle spasms, Increased fascial restricitons  Visit Diagnosis: Chronic left shoulder pain - Plan: PT plan of care cert/re-cert  Stiffness of left shoulder, not elsewhere classified - Plan: PT plan of care cert/re-cert  Abnormal posture - Plan: PT plan of care cert/re-cert  Muscle weakness (generalized) - Plan:  PT plan of care cert/re-cert     Problem List Patient Active Problem List   Diagnosis Date Noted  . Left scapula fracture 01/04/2017      Laureen Abrahams, PT, DPT 04/12/17 12:45 PM     [facility] [address] [place], [place], [address] Phone: [phone]   Fax:  [phone]  Name: Joshua Durham MRN: [id number] Date of Birth: 21-Feb-1951

## 2017-04-12 NOTE — Patient Instructions (Signed)
    Cane Exercise: Flexion   Lie on back, holding cane above chest. Keeping arms as straight as possible, lower cane toward floor beyond head. Hold __3-5__ seconds. Repeat __10-15__ times. Do __2-3__ sessions per day.   SHOULDER: External Rotation - Supine (Cane)   Hold cane with both hands. Rotate arm away from body. Keep elbow on bed and next to body. _10-15__ reps per set, _2-3__ sets per day, __7_ days per week Add towel to keep elbow at side.  Cane Exercise: Abduction    Lie on your back for this exercise! Hold cane with right hand over end, palm-up, with other hand palm-down. Move arm out from side and up by pushing with other arm. Hold __3-5__ seconds. Repeat _10-15___ times. Do __2-3__ sessions per day.  http://[email or web address].exer.us/81   Copyright  VHI. All rights reserved.

## 2017-04-19 ENCOUNTER — Ambulatory Visit: Payer: Medicare Other | Admitting: Physical Therapy

## 2017-04-19 ENCOUNTER — Encounter: Payer: Self-pay | Admitting: Physical Therapy

## 2017-04-19 DIAGNOSIS — M25512 Pain in left shoulder: Secondary | ICD-10-CM | POA: Diagnosis not present

## 2017-04-19 DIAGNOSIS — G8929 Other chronic pain: Secondary | ICD-10-CM | POA: Diagnosis not present

## 2017-04-19 DIAGNOSIS — R293 Abnormal posture: Secondary | ICD-10-CM

## 2017-04-19 DIAGNOSIS — M25612 Stiffness of left shoulder, not elsewhere classified: Secondary | ICD-10-CM | POA: Diagnosis not present

## 2017-04-19 DIAGNOSIS — M6281 Muscle weakness (generalized): Secondary | ICD-10-CM | POA: Diagnosis not present

## 2017-04-19 NOTE — Therapy (Signed)
[place], [place], [address] Phone: [phone]   Fax:  [phone]  Physical Therapy Treatment  Patient Details  Name: Joshua Durham MRN: [id number] Date of Birth: 1951/02/06 Referring Provider: Dr. Rodell Perna   Encounter Date: 04/19/2017  PT End of Session - 04/19/17 1009    Visit Number  2    Number of Visits  16    Date for PT Re-Evaluation  06/07/17    Authorization Type  Medicare/BCBS    PT Start Time  0930    PT Stop Time  1012    PT Time Calculation (min)  42 min    Activity Tolerance  Patient tolerated treatment well    Behavior During Therapy  [facility] for tasks assessed/performed       Past Medical History:  Diagnosis Date  . Hypertension     History reviewed. No pertinent surgical history.  There were no vitals filed for this visit.  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  No/denies                      [facility] Adult PT Treatment/Exercise - 04/19/17 0933      Shoulder Exercises: Supine   External Rotation  AAROM;Left;10 reps 2 sets    External Rotation Limitations  cane    Flexion  AAROM;10 reps 2 sets    Flexion Limitations  cane    ABduction  AAROM;Left;10 reps 2 sets    ABduction Limitations  cane      Shoulder Exercises: Standing   Flexion  AAROM;Left;10 reps wall ladder    Extension  Both;20 reps;Theraband    Theraband Level (Shoulder Extension)  Level 3 (Green)    Row  [employer];Theraband    Theraband Level (Shoulder Row)  Level 3 (Green)      Shoulder Exercises: Pulleys   Flexion  3 minutes    ABduction  3 minutes scaption      Manual Therapy   Manual Therapy  Passive ROM;Joint mobilization    Joint Mobilization  GH distraction to help with guarding    Passive ROM  Lt shoulder all motions with  end range holds               PT Short Term Goals - 04/12/17 1240      PT SHORT TERM GOAL #1   Title  independent with HEP    Status  New    Target Date  05/10/17      PT SHORT TERM GOAL #2   Title  improve Lt shoulder AROM flexion, abduction and er by 10 degrees all motions for improved function    Status  New    Target Date  05/10/17      PT SHORT TERM GOAL #3   Title  report pain < 2/10 with activity for improved function    Status  New    Target Date  05/10/17        PT Long Term Goals - 04/12/17 1241      PT LONG TERM GOAL #1   Title  independent with advanced HEP    Status  New    Target Date  06/07/17      PT LONG TERM GOAL #2  Title  improve Lt shoulder AROM by at least 15 degrees for improved function    Status  New    Target Date  06/07/17      PT LONG TERM GOAL #3   Title  report ability to perform ADLs without increase in pain for improved function    Status  New    Target Date  06/07/17            Plan - 04/19/17 1009    Clinical Impression Statement  Pt demonstrated compliance with HEP and tolerated AA exercises well today.  Slowly progressing, and ROM continues to be limited.  No goals met as only 2nd visit.    PT Treatment/Interventions  ADLs/Self Care Home Management;Cryotherapy;Electrical Stimulation;Moist Heat;Therapeutic exercise;Therapeutic activities;Functional mobility training;Patient/family education;Manual techniques;Vasopneumatic Device;Taping;Dry needling;Passive range of motion    PT Next Visit Plan  PROM and AAROM for Lt shoulder, isometrics?; progress to active as able    Consulted and Agree with Plan of Care  Patient       Patient will benefit from skilled therapeutic intervention in order to improve the following deficits and impairments:  Postural dysfunction, Decreased range of motion, Decreased strength, Pain, Impaired UE functional use, Increased muscle spasms, Increased fascial restricitons  Visit  Diagnosis: Chronic left shoulder pain  Stiffness of left shoulder, not elsewhere classified  Abnormal posture  Muscle weakness (generalized)     Problem List Patient Active Problem List   Diagnosis Date Noted  . Left scapula fracture 01/04/2017      Laureen Abrahams, PT, DPT 04/19/17 10:11 AM     [facility] [address] [place], [place], [address] Phone: [phone]   Fax:  [phone]  Name: TREYVON BLAHUT MRN: [id number] Date of Birth: [birth year]

## 2017-04-21 ENCOUNTER — Ambulatory Visit: Payer: Medicare Other | Admitting: Physical Therapy

## 2017-04-21 ENCOUNTER — Encounter: Payer: Self-pay | Admitting: Physical Therapy

## 2017-04-21 DIAGNOSIS — M25512 Pain in left shoulder: Secondary | ICD-10-CM | POA: Diagnosis not present

## 2017-04-21 DIAGNOSIS — G8929 Other chronic pain: Secondary | ICD-10-CM

## 2017-04-21 DIAGNOSIS — M25612 Stiffness of left shoulder, not elsewhere classified: Secondary | ICD-10-CM | POA: Diagnosis not present

## 2017-04-21 DIAGNOSIS — M6281 Muscle weakness (generalized): Secondary | ICD-10-CM | POA: Diagnosis not present

## 2017-04-21 DIAGNOSIS — R293 Abnormal posture: Secondary | ICD-10-CM

## 2017-04-21 NOTE — Therapy (Signed)
[place], [place], [address] Phone: [phone]   Fax:  [phone]  Physical Therapy Treatment  Patient Details  Name: Joshua Durham MRN: [id number] Date of Birth: December 04, 1950 Referring Provider: Dr. Rodell Perna   Encounter Date: 04/21/2017  PT End of Session - 04/21/17 1138    Visit Number  3    Number of Visits  16    Date for PT Re-Evaluation  06/07/17    Authorization Type  Medicare/BCBS    PT Start Time  1100    PT Stop Time  1139    PT Time Calculation (min)  39 min    Activity Tolerance  Patient tolerated treatment well    Behavior During Therapy  [facility] for tasks assessed/performed       Past Medical History:  Diagnosis Date  . Hypertension     History reviewed. No pertinent surgical history.  There were no vitals filed for this visit.  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  No/denies just reports stiffness          [facility] PT Assessment - 04/21/17 1112      AROM   Left Shoulder Flexion  90 Degrees    Left Shoulder ABduction  75 Degrees    Left Shoulder External Rotation  29 Degrees                  [facility] Adult PT Treatment/Exercise - 04/21/17 1103      Shoulder Exercises: Seated   Other Seated Exercises  UE ranger: horizontal abduction/adduction x20 reps; flexion x 10 reps      Shoulder Exercises: Prone   Retraction  Left;20 reps;Weights;Limitations    Retraction Weight (lbs)  2    Retraction Limitations  standing in forward bending    Extension  Left;20 reps;Weights;Limitations    Extension Weight (lbs)  2    Extension Limitations  standing in forward bending    Horizontal ABduction 1  Left;20 reps;Weights;Limitations    Horizontal ABduction 1 Weight (lbs)  2    Horizontal ABduction 1  Limitations  standing in forward bending      Shoulder Exercises: Standing   Flexion  AAROM;Left;10 reps wall ladder    Extension  Both;20 reps;Theraband    Theraband Level (Shoulder Extension)  Level 3 (Green)    Row  [employer];Theraband    Theraband Level (Shoulder Row)  Level 3 (Green)    Other Standing Exercises  cane with mirror to decrease shoulder shrug: flexion and abduction x 10 reps each      Shoulder Exercises: Pulleys   Flexion  3 minutes    ABduction  3 minutes scaption      Manual Therapy   Manual Therapy  Passive ROM    Passive ROM  Lt shoulder all motions with end range holds               PT Short Term Goals - 04/12/17 1240      PT SHORT TERM GOAL #1   Title  independent with HEP    Status  New    Target Date  05/10/17      PT SHORT TERM GOAL #2   Title  improve Lt shoulder AROM flexion, abduction and er by 10  degrees all motions for improved function    Status  New    Target Date  05/10/17      PT SHORT TERM GOAL #3   Title  report pain < 2/10 with activity for improved function    Status  New    Target Date  05/10/17        PT Long Term Goals - 04/12/17 1241      PT LONG TERM GOAL #1   Title  independent with advanced HEP    Status  New    Target Date  06/07/17      PT LONG TERM GOAL #2   Title  improve Lt shoulder AROM by at least 15 degrees for improved function    Status  New    Target Date  06/07/17      PT LONG TERM GOAL #3   Title  report ability to perform ADLs without increase in pain for improved function    Status  New    Target Date  06/07/17            Plan - 04/21/17 1140    Clinical Impression Statement  Pt demonstrated improved ROM in Lt shoulder today and is progressing well.  ROM still limited but improving.    PT Treatment/Interventions  ADLs/Self Care Home Management;Cryotherapy;Electrical Stimulation;Moist Heat;Therapeutic exercise;Therapeutic activities;Functional mobility training;Patient/family  education;Manual techniques;Vasopneumatic Device;Taping;Dry needling;Passive range of motion    PT Next Visit Plan  PROM and AAROM for Lt shoulder, isometrics?; progress to active as able    Consulted and Agree with Plan of Care  Patient       Patient will benefit from skilled therapeutic intervention in order to improve the following deficits and impairments:  Postural dysfunction, Decreased range of motion, Decreased strength, Pain, Impaired UE functional use, Increased muscle spasms, Increased fascial restricitons  Visit Diagnosis: Chronic left shoulder pain  Stiffness of left shoulder, not elsewhere classified  Abnormal posture  Muscle weakness (generalized)     Problem List Patient Active Problem List   Diagnosis Date Noted  . Left scapula fracture 01/04/2017      Laureen Abrahams, PT, DPT 04/21/17 11:41 AM    [facility] [address] [place], [place], [address] Phone: [phone]   Fax:  [phone]  Name: DSEAN VANTOL MRN: [id number] Date of Birth: 18-Sep-1950

## 2017-04-26 ENCOUNTER — Encounter: Payer: Self-pay | Admitting: Physical Therapy

## 2017-04-26 ENCOUNTER — Ambulatory Visit: Payer: Medicare Other | Admitting: Physical Therapy

## 2017-04-26 DIAGNOSIS — M25612 Stiffness of left shoulder, not elsewhere classified: Secondary | ICD-10-CM

## 2017-04-26 DIAGNOSIS — M6281 Muscle weakness (generalized): Secondary | ICD-10-CM

## 2017-04-26 DIAGNOSIS — R293 Abnormal posture: Secondary | ICD-10-CM

## 2017-04-26 DIAGNOSIS — M25512 Pain in left shoulder: Secondary | ICD-10-CM | POA: Diagnosis not present

## 2017-04-26 DIAGNOSIS — G8929 Other chronic pain: Secondary | ICD-10-CM

## 2017-04-26 NOTE — Therapy (Signed)
[place], [place], [address] Phone: [phone]   Fax:  [phone]  Physical Therapy Treatment  Patient Details  Name: LANDON BASSFORD MRN: [id number] Date of Birth: 02/28/1951 Referring Provider: Dr. Rodell Perna   Encounter Date: 04/26/2017  PT End of Session - 04/26/17 1034    Visit Number  4    Number of Visits  16    Date for PT Re-Evaluation  06/07/17    Authorization Type  Medicare/BCBS    PT Start Time  1015    PT Stop Time  1110    PT Time Calculation (min)  55 min       Past Medical History:  Diagnosis Date  . Hypertension     History reviewed. No pertinent surgical history.  There were no vitals filed for this visit.  Subjective Assessment - 04/26/17 1019    Subjective  Very little pain when i am not moving. It hurts a lot with a good workout.     Currently in Pain?  No/denies up to 5-6/10    Pain Location  Shoulder    Pain Orientation  Left    Aggravating Factors   reach out to the side     Pain Relieving Factors  rest                       [facility] Adult PT Treatment/Exercise - 04/26/17 0001      Shoulder Exercises: Seated   Retraction  10 reps      Shoulder Exercises: Standing   Extension  Both;20 reps;Theraband    Theraband Level (Shoulder Extension)  Level 3 (Green)    Row  [employer];Theraband    Theraband Level (Shoulder Row)  Level 3 (Green)    Other Standing Exercises  UE ranger below 90 flexion, horizontals, ER/IR , IR reach behind with ranger, towel in axilla, self MOB for IR-pull toward right hip. AAROM reach back and hold 30 sec x 3 , standing cane/UE ranger extension and IR x 10 each       Shoulder Exercises: Pulleys   Flexion  3 minutes      Modalities   Modalities  --      Moist Heat Therapy   Number Minutes Moist Heat  10 Minutes    Moist Heat Location  Shoulder      Manual Therapy   Passive ROM  Lt shoulder all motions with end range  holds               PT Short Term Goals - 04/12/17 1240      PT SHORT TERM GOAL #1   Title  independent with HEP    Status  New    Target Date  05/10/17      PT SHORT TERM GOAL #2   Title  improve Lt shoulder AROM flexion, abduction and er by 10 degrees all motions for improved function    Status  New    Target Date  05/10/17      PT SHORT TERM GOAL #3   Title  report pain < 2/10 with activity for improved function    Status  New    Target Date  05/10/17        PT Long Term Goals - 04/12/17 1241      PT LONG TERM GOAL #1   Title  independent with advanced HEP    Status  New    Target  Date  06/07/17      PT LONG TERM GOAL #2   Title  improve Lt shoulder AROM by at least 15 degrees for improved function    Status  New    Target Date  06/07/17      PT LONG TERM GOAL #3   Title  report ability to perform ADLs without increase in pain for improved function    Status  New    Target Date  06/07/17            Plan - 04/26/17 1039    Clinical Impression Statement   IR reach improved from left pocket to Lower Lumbar after treatment.     PT Next Visit Plan  PROM and AAROM for Lt shoulder, isometrics?; progress to active as able    Consulted and Agree with Plan of Care  Patient       Patient will benefit from skilled therapeutic intervention in order to improve the following deficits and impairments:  Postural dysfunction, Decreased range of motion, Decreased strength, Pain, Impaired UE functional use, Increased muscle spasms, Increased fascial restricitons  Visit Diagnosis: Chronic left shoulder pain  Stiffness of left shoulder, not elsewhere classified  Abnormal posture  Muscle weakness (generalized)     Problem List Patient Active Problem List   Diagnosis Date Noted  . Left scapula fracture 01/04/2017    Hessie Diener [facility] 04/26/2017, 11:02 AM  [facility] [address] [place], [place], [address] Phone: [phone]   Fax:  [phone]  Name: SUHAN PACI MRN: [id number] Date of Birth: 07/24/1950

## 2017-04-28 ENCOUNTER — Encounter: Payer: Self-pay | Admitting: Physical Therapy

## 2017-04-28 ENCOUNTER — Ambulatory Visit: Payer: Medicare Other | Admitting: Physical Therapy

## 2017-04-28 DIAGNOSIS — R293 Abnormal posture: Secondary | ICD-10-CM | POA: Diagnosis not present

## 2017-04-28 DIAGNOSIS — M6281 Muscle weakness (generalized): Secondary | ICD-10-CM | POA: Diagnosis not present

## 2017-04-28 DIAGNOSIS — M25512 Pain in left shoulder: Secondary | ICD-10-CM | POA: Diagnosis not present

## 2017-04-28 DIAGNOSIS — G8929 Other chronic pain: Secondary | ICD-10-CM | POA: Diagnosis not present

## 2017-04-28 DIAGNOSIS — M25612 Stiffness of left shoulder, not elsewhere classified: Secondary | ICD-10-CM

## 2017-04-28 NOTE — Therapy (Addendum)
[place], [place], [address] Phone: [phone]   Fax:  [phone]  Physical Therapy Treatment/Discharge  Patient Details  Name: Joshua Durham MRN: [id number] Date of Birth: 12-12-1950 Referring Provider: Dr. Rodell Perna   Encounter Date: 04/28/2017  PT End of Session - 04/28/17 1051    Visit Number  5    Number of Visits  16    Date for PT Re-Evaluation  06/07/17    Authorization Type  Medicare/BCBS    PT Start Time  1015    PT Stop Time  1100    PT Time Calculation (min)  45 min       Past Medical History:  Diagnosis Date  . Hypertension     History reviewed. No pertinent surgical history.  There were no vitals filed for this visit.  Subjective Assessment - 04/28/17 1018    Subjective  Getting over the last visit. Was very sore, swells into arm pit and down the arm.     Diagnostic tests  xray: Severely comminuted scapular fracture with remaining displacement at the base of the coracoid scapular body.  Fracture lines at the scapular body have healed..    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  Yes    Pain Score  3     Pain Location  Scapula    Pain Orientation  Left         [facility] PT Assessment - 04/28/17 0001      AROM   Left Shoulder Flexion  90 Degrees    Left Shoulder ABduction  75 Degrees    Left Shoulder External Rotation  30 Degrees                  [facility] Adult PT Treatment/Exercise - 04/28/17 0001      Shoulder Exercises: Prone   Retraction  Left;20 reps;Weights;Limitations    Retraction Weight (lbs)  2    Extension  Left;20 reps;Weights;Limitations    Extension Weight (lbs)  2    Horizontal ABduction 1  Left;20 reps;Limitations;AROM    Horizontal ABduction 1 Weight (lbs)  --      Shoulder Exercises: Sidelying   External Rotation  15 reps      Shoulder Exercises: Standing   External Rotation  10  reps red    Internal Rotation  10 reps red    Extension  Both;20 reps;Theraband    Theraband Level (Shoulder Extension)  Level 3 (Green)    Row  [employer];Theraband    Theraband Level (Shoulder Row)  Level 3 (Green)    Other Standing Exercises  UE ranger below 90 flexion, horizontals, ER/IR , IR reach behind with ranger, towel in axilla, self MOB for IR-pull toward right hip. AAROM reach back and hold 30 sec x 3 , standing cane/UE ranger extension and IR x 10 each       Shoulder Exercises: Pulleys   Flexion  3 minutes      Moist Heat Therapy   Number Minutes Moist Heat  10 Minutes    Moist Heat Location  Shoulder      Manual Therapy   Passive ROM  Lt shoulder all motions with end range holds             PT Education - 04/28/17 1056    Education provided  Yes    Education Details  HEP  Person(s) Educated  Patient    Methods  Explanation;Handout    Comprehension  Verbalized understanding       PT Short Term Goals - 04/12/17 1240      PT SHORT TERM GOAL #1   Title  independent with HEP    Status  New    Target Date  05/10/17      PT SHORT TERM GOAL #2   Title  improve Lt shoulder AROM flexion, abduction and er by 10 degrees all motions for improved function    Status  New    Target Date  05/10/17      PT SHORT TERM GOAL #3   Title  report pain < 2/10 with activity for improved function    Status  New    Target Date  05/10/17        PT Long Term Goals - 04/12/17 1241      PT LONG TERM GOAL #1   Title  independent with advanced HEP    Status  New    Target Date  06/07/17      PT LONG TERM GOAL #2   Title  improve Lt shoulder AROM by at least 15 degrees for improved function    Status  New    Target Date  06/07/17      PT LONG TERM GOAL #3   Title  report ability to perform ADLs without increase in pain for improved function    Status  New    Target Date  06/07/17            Plan - 04/28/17 1104    Clinical Impression Statement  Pt  reports sore and swollen after last treatment, better today. Focused scapular strength and mobility. USed scapular mobs and guidance during P/AAROM. Soft tissue to mid traps/rhomboids. Updated HEP with scapular bands and RTC strengthening. Will return in 2 weeks after going on [place].     PT Next Visit Plan  assess progres and goals next visit. PROM and AAROM for Lt shoulder, progress to active as able    PT Home Exercise Plan  wall walks, cane flexion/ abduction in standing supine cane pullovers and ER, scap bands red, IR/ER red band     Consulted and Agree with Plan of Care  Patient       Patient will benefit from skilled therapeutic intervention in order to improve the following deficits and impairments:  Postural dysfunction, Decreased range of motion, Decreased strength, Pain, Impaired UE functional use, Increased muscle spasms, Increased fascial restricitons  Visit Diagnosis: No diagnosis found.     Problem List Patient Active Problem List   Diagnosis Date Noted  . Left scapula fracture 01/04/2017    Dorene Ar, PTA 04/28/2017, 11:19 AM  [place], [place], [address] Phone: [phone]   Fax:  [phone]  Name: Joshua Durham MRN: [id number] Date of Birth: 1950-08-01       PHYSICAL THERAPY DISCHARGE SUMMARY  Visits from Start of Care: 5  Current functional level related to goals / functional outcomes: See above   Remaining deficits: See above, pt getting CT scan   Education / Equipment: HEP  Plan: Patient agrees to discharge.  Patient goals were not met. Patient is being discharged due to the physician's request.  ?????    Laureen Abrahams, PT, DPT 06/10/17 9:22 AM  [facility] [address] [place], [place] [address]  [phone] (office) [phone] (  fax)

## 2017-04-28 NOTE — Patient Instructions (Signed)
Strengthening: Resisted Internal Rotation   Hold tubing in left hand, elbow at side and forearm out. Rotate forearm in across body. Repeat ___20_ times per set. Do _1-2___ sets per session. Do __2__ sessions per day.  http://orth.exer.us/830   Copyright  VHI. All rights reserved.  Strengthening: Resisted External Rotation   Hold tubing in right hand, elbow at side and forearm across body. Rotate forearm out. Repeat ___10-20_ times per set. Do ___2_ sets per session. Do __2__ sessions per day.   Strengthening: Resisted Extension   Hold tubing in right hand, arm forward. Pull arm back, elbow straight. Repeat __20__ times per set. Do __2__ sets per session. Do ___2_ sessions per day.   Resistive Band Rowing   With resistive band anchored in door, grasp both ends. Keeping elbows bent, pull back, squeezing shoulder blades together. Hold __5__ seconds. Repeat __20__ times. Do _2___ sessions per day.

## 2017-05-17 ENCOUNTER — Ambulatory Visit: Payer: Medicare Other | Admitting: Physical Therapy

## 2017-05-19 ENCOUNTER — Ambulatory Visit: Payer: Medicare Other | Admitting: Physical Therapy

## 2017-05-24 ENCOUNTER — Ambulatory Visit (INDEPENDENT_AMBULATORY_CARE_PROVIDER_SITE_OTHER): Payer: Medicare Other | Admitting: Orthopaedic Surgery

## 2017-05-24 ENCOUNTER — Ambulatory Visit: Payer: Medicare Other | Admitting: Physical Therapy

## 2017-05-24 ENCOUNTER — Ambulatory Visit (INDEPENDENT_AMBULATORY_CARE_PROVIDER_SITE_OTHER): Payer: Medicare Other

## 2017-05-24 ENCOUNTER — Encounter (INDEPENDENT_AMBULATORY_CARE_PROVIDER_SITE_OTHER): Payer: Self-pay | Admitting: Orthopaedic Surgery

## 2017-05-24 VITALS — BP 164/73 | HR 65

## 2017-05-24 DIAGNOSIS — S42102D Fracture of unspecified part of scapula, left shoulder, subsequent encounter for fracture with routine healing: Secondary | ICD-10-CM

## 2017-05-24 MED ORDER — TRAMADOL HCL 50 MG PO TABS: 50.0000 mg | ORAL_TABLET | Freq: Four times a day (QID) | ORAL | 0 refills | Status: DC | PRN
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
## 2017-05-24 NOTE — Addendum Note (Signed)
Addended by: Meyer Cory on: 05/24/2017 09:32 AM   Modules accepted: Orders

## 2017-05-26 ENCOUNTER — Ambulatory Visit: Payer: Medicare Other | Admitting: Physical Therapy

## 2017-05-26 ENCOUNTER — Other Ambulatory Visit: Payer: Self-pay | Admitting: Family Medicine

## 2017-05-26 DIAGNOSIS — Z1211 Encounter for screening for malignant neoplasm of colon: Secondary | ICD-10-CM | POA: Diagnosis not present

## 2017-05-26 DIAGNOSIS — N400 Enlarged prostate without lower urinary tract symptoms: Secondary | ICD-10-CM | POA: Diagnosis not present

## 2017-05-26 DIAGNOSIS — E041 Nontoxic single thyroid nodule: Secondary | ICD-10-CM | POA: Diagnosis not present

## 2017-05-26 DIAGNOSIS — N529 Male erectile dysfunction, unspecified: Secondary | ICD-10-CM | POA: Diagnosis not present

## 2017-05-26 DIAGNOSIS — I129 Hypertensive chronic kidney disease with stage 1 through stage 4 chronic kidney disease, or unspecified chronic kidney disease: Secondary | ICD-10-CM | POA: Diagnosis not present

## 2017-05-26 DIAGNOSIS — R7301 Impaired fasting glucose: Secondary | ICD-10-CM | POA: Diagnosis not present

## 2017-05-26 DIAGNOSIS — N183 Chronic kidney disease, stage 3 (moderate): Secondary | ICD-10-CM | POA: Diagnosis not present

## 2017-05-26 DIAGNOSIS — Z125 Encounter for screening for malignant neoplasm of prostate: Secondary | ICD-10-CM | POA: Diagnosis not present

## 2017-05-26 DIAGNOSIS — S42115D Nondisplaced fracture of body of scapula, left shoulder, subsequent encounter for fracture with routine healing: Secondary | ICD-10-CM | POA: Diagnosis not present

## 2017-05-26 DIAGNOSIS — E782 Mixed hyperlipidemia: Secondary | ICD-10-CM | POA: Diagnosis not present

## 2017-05-26 DIAGNOSIS — Z0000 Encounter for general adult medical examination without abnormal findings: Secondary | ICD-10-CM | POA: Diagnosis not present

## 2017-05-26 DIAGNOSIS — Z23 Encounter for immunization: Secondary | ICD-10-CM | POA: Diagnosis not present

## 2017-06-01 ENCOUNTER — Ambulatory Visit
Admission: RE | Admit: 2017-06-01 | Discharge: 2017-06-01 | Disposition: A | Discharge status: Home / Self Care | Payer: Medicare Other | Source: Ambulatory Visit | Attending: Family Medicine | Admitting: Family Medicine

## 2017-06-01 DIAGNOSIS — E041 Nontoxic single thyroid nodule: Secondary | ICD-10-CM | POA: Diagnosis not present

## 2017-06-07 ENCOUNTER — Ambulatory Visit
Admission: RE | Admit: 2017-06-07 | Discharge: 2017-06-07 | Disposition: A | Discharge status: Home / Self Care | Payer: Medicare Other | Source: Ambulatory Visit | Attending: Orthopaedic Surgery | Admitting: Orthopaedic Surgery

## 2017-06-07 ENCOUNTER — Other Ambulatory Visit: Payer: Self-pay | Admitting: Family Medicine

## 2017-06-07 DIAGNOSIS — S42102D Fracture of unspecified part of scapula, left shoulder, subsequent encounter for fracture with routine healing: Secondary | ICD-10-CM

## 2017-06-07 DIAGNOSIS — E041 Nontoxic single thyroid nodule: Secondary | ICD-10-CM

## 2017-06-07 DIAGNOSIS — S42102A Fracture of unspecified part of scapula, left shoulder, initial encounter for closed fracture: Secondary | ICD-10-CM | POA: Diagnosis not present

## 2017-06-14 ENCOUNTER — Other Ambulatory Visit (HOSPITAL_COMMUNITY)
Admission: RE | Admit: 2017-06-14 | Discharge: 2017-06-14 | Disposition: A | Discharge status: Home / Self Care | Payer: Medicare Other | Source: Ambulatory Visit | Attending: Radiology | Admitting: Radiology

## 2017-06-14 ENCOUNTER — Ambulatory Visit
Admission: RE | Admit: 2017-06-14 | Discharge: 2017-06-14 | Disposition: A | Discharge status: Home / Self Care | Payer: Medicare Other | Source: Ambulatory Visit | Attending: Family Medicine | Admitting: Family Medicine

## 2017-06-14 DIAGNOSIS — E041 Nontoxic single thyroid nodule: Secondary | ICD-10-CM

## 2017-06-15 ENCOUNTER — Encounter (INDEPENDENT_AMBULATORY_CARE_PROVIDER_SITE_OTHER): Payer: Self-pay | Admitting: Orthopaedic Surgery

## 2017-06-15 ENCOUNTER — Ambulatory Visit (INDEPENDENT_AMBULATORY_CARE_PROVIDER_SITE_OTHER): Payer: Medicare Other | Admitting: Orthopaedic Surgery

## 2017-06-15 VITALS — BP 130/69 | HR 57

## 2017-06-15 DIAGNOSIS — S42112K Displaced fracture of body of scapula, left shoulder, subsequent encounter for fracture with nonunion: Secondary | ICD-10-CM
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
## 2017-06-16 ENCOUNTER — Encounter (INDEPENDENT_AMBULATORY_CARE_PROVIDER_SITE_OTHER): Payer: Self-pay | Admitting: Orthopaedic Surgery

## 2017-07-26 ENCOUNTER — Other Ambulatory Visit (INDEPENDENT_AMBULATORY_CARE_PROVIDER_SITE_OTHER): Payer: Self-pay | Admitting: Orthopaedic Surgery

## 2017-07-26 NOTE — Telephone Encounter (Signed)
Rx refill

## 2017-07-26 NOTE — Telephone Encounter (Signed)
oK THANKS 

## 2017-09-08 ENCOUNTER — Other Ambulatory Visit (INDEPENDENT_AMBULATORY_CARE_PROVIDER_SITE_OTHER): Payer: Self-pay | Admitting: Orthopaedic Surgery

## 2017-09-08 NOTE — Telephone Encounter (Signed)
Give Ultram 50 mg 1 tab p.o. every 12 hours as needed pain number 50 tablets no refills

## 2017-09-08 NOTE — Telephone Encounter (Signed)
Can you please advise for Dr. Lorin Mercy since he is out of the office? Patient has a nonunion of a scapular fracture.

## 2017-09-16 ENCOUNTER — Ambulatory Visit (INDEPENDENT_AMBULATORY_CARE_PROVIDER_SITE_OTHER): Payer: Medicare Other | Admitting: Orthopaedic Surgery

## 2017-09-22 ENCOUNTER — Other Ambulatory Visit (HOSPITAL_COMMUNITY): Payer: Self-pay | Admitting: *Deleted

## 2017-09-27 ENCOUNTER — Ambulatory Visit (INDEPENDENT_AMBULATORY_CARE_PROVIDER_SITE_OTHER): Payer: Medicare Other | Admitting: Orthopaedic Surgery

## 2017-10-12 ENCOUNTER — Ambulatory Visit (INDEPENDENT_AMBULATORY_CARE_PROVIDER_SITE_OTHER): Payer: Medicare Other | Admitting: Orthopaedic Surgery

## 2017-10-12 ENCOUNTER — Ambulatory Visit (INDEPENDENT_AMBULATORY_CARE_PROVIDER_SITE_OTHER): Payer: Medicare Other

## 2017-10-12 ENCOUNTER — Encounter (INDEPENDENT_AMBULATORY_CARE_PROVIDER_SITE_OTHER): Payer: Self-pay | Admitting: Orthopaedic Surgery

## 2017-10-12 VITALS — BP 130/68 | HR 77 | Ht 70.0 in | Wt 200.0 lb

## 2017-10-12 DIAGNOSIS — S42112K Displaced fracture of body of scapula, left shoulder, subsequent encounter for fracture with nonunion: Secondary | ICD-10-CM
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
## 2017-11-01 DIAGNOSIS — R3912 Poor urinary stream: Secondary | ICD-10-CM | POA: Diagnosis not present

## 2017-11-01 DIAGNOSIS — N401 Enlarged prostate with lower urinary tract symptoms: Secondary | ICD-10-CM | POA: Diagnosis not present

## 2017-11-08 DIAGNOSIS — D122 Benign neoplasm of ascending colon: Secondary | ICD-10-CM | POA: Diagnosis not present

## 2017-11-08 DIAGNOSIS — Z1211 Encounter for screening for malignant neoplasm of colon: Secondary | ICD-10-CM | POA: Diagnosis not present

## 2017-11-08 DIAGNOSIS — D120 Benign neoplasm of cecum: Secondary | ICD-10-CM | POA: Diagnosis not present

## 2017-11-11 DIAGNOSIS — D120 Benign neoplasm of cecum: Secondary | ICD-10-CM | POA: Diagnosis not present

## 2017-11-11 DIAGNOSIS — D122 Benign neoplasm of ascending colon: Secondary | ICD-10-CM | POA: Diagnosis not present

## 2017-11-23 DIAGNOSIS — E782 Mixed hyperlipidemia: Secondary | ICD-10-CM | POA: Diagnosis not present

## 2017-11-23 DIAGNOSIS — I129 Hypertensive chronic kidney disease with stage 1 through stage 4 chronic kidney disease, or unspecified chronic kidney disease: Secondary | ICD-10-CM | POA: Diagnosis not present

## 2017-11-23 DIAGNOSIS — R7301 Impaired fasting glucose: Secondary | ICD-10-CM | POA: Diagnosis not present

## 2017-11-23 DIAGNOSIS — N183 Chronic kidney disease, stage 3 (moderate): Secondary | ICD-10-CM | POA: Diagnosis not present

## 2018-02-01 DIAGNOSIS — Z23 Encounter for immunization: Secondary | ICD-10-CM | POA: Diagnosis not present

## 2018-04-18 ENCOUNTER — Encounter (INDEPENDENT_AMBULATORY_CARE_PROVIDER_SITE_OTHER): Payer: Self-pay | Admitting: Orthopaedic Surgery

## 2018-04-18 ENCOUNTER — Ambulatory Visit (INDEPENDENT_AMBULATORY_CARE_PROVIDER_SITE_OTHER): Payer: Medicare Other | Admitting: Orthopaedic Surgery

## 2018-04-18 ENCOUNTER — Ambulatory Visit (INDEPENDENT_AMBULATORY_CARE_PROVIDER_SITE_OTHER): Payer: Medicare Other

## 2018-04-18 VITALS — BP 145/73 | HR 55 | Ht 70.0 in | Wt 202.0 lb

## 2018-04-18 DIAGNOSIS — S42112K Displaced fracture of body of scapula, left shoulder, subsequent encounter for fracture with nonunion: Secondary | ICD-10-CM | POA: Diagnosis not present

## 2018-04-18 NOTE — Progress Notes (Signed)
Office Visit Note   Patient: Joshua Durham           Date of Birth: 10/24/1950           MRN: [id number] Visit Date: 04/18/2018              Requested by: Mayra Neer, MD [address] [place], [place] [address] PCP: Mayra Neer, MD   Assessment & Plan: Visit Diagnoses:  1. 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  Scapula is displaced from the coracoid inferiorly with nonunion at the base of the coracoid process.  The body of the scapula is healed.  Plan: We discussed referral to a university setting for consideration of scapular plating bone grafting.  We discussed potential for nonunion.  He will call if he like to have a set up the referral.  He has noted gradually every few months improvement in his shoulder function and less symptoms and increased ability to process of paid in normal activities.  Follow-Up Instructions: Return if symptoms worsen or fail to improve.   Orders:  Orders Placed This Encounter  Procedures  . XR Scapula Left   No orders of the defined types were placed in this encounter.     Procedures: No procedures performed   Clinical Data: No additional findings.   Subjective: Chief Complaint  Patient presents with  . Left Shoulder - Follow-up    DOI 01/04/17    HPI patient returns with scapular nonunion post October 2018 injury.  He states he tends to do better if he is more active.  He is able to load which does, stack wood.  He is just using Tylenol for pain.  No numbness or tingling in his fingers.  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  Review of Systems updated unchanged from last office visit 14 point systems.   Objective: Vital Signs: BP (!) 145/73   Pulse (!) 55   Ht 5\' 10"  (1.778 m)   Wt 202 lb (91.6 kg)   BMI 28.98 kg/m   Physical Exam Constitutional:      Appearance: He is well-developed.  HENT:     Head:  Normocephalic and atraumatic.  Eyes:     Pupils: Pupils are equal, round, and reactive to light.  Neck:     Thyroid: No thyromegaly.     Trachea: No tracheal deviation.  Cardiovascular:     Rate and Rhythm: Normal rate.  Pulmonary:     Effort: Pulmonary effort is normal.     Breath sounds: No wheezing.  Abdominal:     General: Bowel sounds are normal.     Palpations: Abdomen is soft.  Skin:    General: Skin is warm and dry.     Capillary Refill: Capillary refill takes less than 2 seconds.  Neurological:     Mental Status: He is alert and oriented to person, place, and time.  Psychiatric:        Behavior: Behavior normal.        Thought Content: Thought content normal.        Judgment: Judgment normal.     Ortho Exam left shoulder lower than right shoulder.  Scapular asymmetry without tenderness.  He can get his arm across his chest he can take his hand but it above his head level.  There is some periscapular atrophy.  Tenderness at the base  of the acromion.  Good motion of the hand grip strength is good.  Normal sensation median ulnar nerve to the hand. Specialty Comments:  No specialty comments available.  Imaging: Xr Scapula Left  Result Date: 04/18/2018 2 view x-rays scapula are obtained and reviewed.  This shows nonunion at the base of the acromium and also coracoid with inferior displacement of the scapula with rotation.  Glenohumeral joint is normal. Impression: Scapular nonunion as described above unchanged from last year's x-rays    PMFS History: Patient Active Problem List   Diagnosis Date Noted  . Left scapula fracture 01/04/2017   Past Medical History:  Diagnosis Date  . Hypertension     History reviewed. No pertinent family history.  History reviewed. No pertinent surgical history. Social History   Occupational History  . Not on file  Tobacco Use  . Smoking status: Never Smoker  . Smokeless tobacco: Never Used  Substance and Sexual Activity  .  Alcohol use: No  . Drug use: No  . Sexual activity: Not on file

## 2018-05-29 DIAGNOSIS — R7301 Impaired fasting glucose: Secondary | ICD-10-CM | POA: Diagnosis not present

## 2018-05-29 DIAGNOSIS — E041 Nontoxic single thyroid nodule: Secondary | ICD-10-CM | POA: Diagnosis not present

## 2018-05-29 DIAGNOSIS — N400 Enlarged prostate without lower urinary tract symptoms: Secondary | ICD-10-CM | POA: Diagnosis not present

## 2018-05-29 DIAGNOSIS — N183 Chronic kidney disease, stage 3 (moderate): Secondary | ICD-10-CM | POA: Diagnosis not present

## 2018-05-29 DIAGNOSIS — E782 Mixed hyperlipidemia: Secondary | ICD-10-CM | POA: Diagnosis not present

## 2018-05-29 DIAGNOSIS — I129 Hypertensive chronic kidney disease with stage 1 through stage 4 chronic kidney disease, or unspecified chronic kidney disease: Secondary | ICD-10-CM | POA: Diagnosis not present

## 2018-05-29 DIAGNOSIS — M109 Gout, unspecified: Secondary | ICD-10-CM | POA: Diagnosis not present

## 2018-05-29 DIAGNOSIS — Z125 Encounter for screening for malignant neoplasm of prostate: Secondary | ICD-10-CM | POA: Diagnosis not present

## 2018-05-29 DIAGNOSIS — Z0000 Encounter for general adult medical examination without abnormal findings: Secondary | ICD-10-CM | POA: Diagnosis not present

## 2018-05-29 DIAGNOSIS — N529 Male erectile dysfunction, unspecified: Secondary | ICD-10-CM | POA: Diagnosis not present

## 2018-05-29 DIAGNOSIS — M1990 Unspecified osteoarthritis, unspecified site: Secondary | ICD-10-CM | POA: Diagnosis not present

## 2018-07-26 DIAGNOSIS — Z20828 Contact with and (suspected) exposure to other viral communicable diseases: Secondary | ICD-10-CM | POA: Diagnosis not present

## 2018-11-13 DIAGNOSIS — M25562 Pain in left knee: Secondary | ICD-10-CM | POA: Diagnosis not present

## 2018-12-29 DIAGNOSIS — Z23 Encounter for immunization: Secondary | ICD-10-CM | POA: Diagnosis not present

## 2019-02-13 DIAGNOSIS — R3912 Poor urinary stream: Secondary | ICD-10-CM | POA: Diagnosis not present

## 2019-02-13 DIAGNOSIS — N401 Enlarged prostate with lower urinary tract symptoms: Secondary | ICD-10-CM | POA: Diagnosis not present

## 2019-02-16 DIAGNOSIS — N183 Chronic kidney disease, stage 3 unspecified: Secondary | ICD-10-CM | POA: Diagnosis not present

## 2019-02-16 DIAGNOSIS — I129 Hypertensive chronic kidney disease with stage 1 through stage 4 chronic kidney disease, or unspecified chronic kidney disease: Secondary | ICD-10-CM | POA: Diagnosis not present

## 2019-02-16 DIAGNOSIS — E782 Mixed hyperlipidemia: Secondary | ICD-10-CM | POA: Diagnosis not present

## 2019-02-16 DIAGNOSIS — E041 Nontoxic single thyroid nodule: Secondary | ICD-10-CM | POA: Diagnosis not present

## 2019-02-16 DIAGNOSIS — R7301 Impaired fasting glucose: Secondary | ICD-10-CM | POA: Diagnosis not present

## 2019-02-21 DIAGNOSIS — E782 Mixed hyperlipidemia: Secondary | ICD-10-CM | POA: Diagnosis not present

## 2019-02-21 DIAGNOSIS — Z7189 Other specified counseling: Secondary | ICD-10-CM | POA: Diagnosis not present

## 2019-02-21 DIAGNOSIS — R7301 Impaired fasting glucose: Secondary | ICD-10-CM | POA: Diagnosis not present

## 2019-02-21 DIAGNOSIS — N183 Chronic kidney disease, stage 3 unspecified: Secondary | ICD-10-CM | POA: Diagnosis not present

## 2019-02-21 DIAGNOSIS — I129 Hypertensive chronic kidney disease with stage 1 through stage 4 chronic kidney disease, or unspecified chronic kidney disease: Secondary | ICD-10-CM | POA: Diagnosis not present
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
## 2019-04-27 ENCOUNTER — Other Ambulatory Visit: Payer: Self-pay | Admitting: Family Medicine

## 2019-04-27 DIAGNOSIS — L989 Disorder of the skin and subcutaneous tissue, unspecified: Secondary | ICD-10-CM | POA: Diagnosis not present

## 2019-04-27 DIAGNOSIS — L980 Pyogenic granuloma: Secondary | ICD-10-CM | POA: Diagnosis not present

## 2019-05-05 ENCOUNTER — Ambulatory Visit: Payer: Medicare Other

## 2019-05-12 ENCOUNTER — Ambulatory Visit: Payer: Medicare Other

## 2019-05-16 ENCOUNTER — Ambulatory Visit: Payer: Medicare Other

## 2019-06-06 DIAGNOSIS — R7309 Other abnormal glucose: Secondary | ICD-10-CM | POA: Diagnosis not present

## 2019-06-06 DIAGNOSIS — M109 Gout, unspecified: Secondary | ICD-10-CM | POA: Diagnosis not present

## 2019-06-06 DIAGNOSIS — I129 Hypertensive chronic kidney disease with stage 1 through stage 4 chronic kidney disease, or unspecified chronic kidney disease: Secondary | ICD-10-CM | POA: Diagnosis not present

## 2019-06-06 DIAGNOSIS — Z0000 Encounter for general adult medical examination without abnormal findings: Secondary | ICD-10-CM | POA: Diagnosis not present

## 2019-06-06 DIAGNOSIS — N400 Enlarged prostate without lower urinary tract symptoms: Secondary | ICD-10-CM | POA: Diagnosis not present

## 2019-06-06 DIAGNOSIS — N1831 Chronic kidney disease, stage 3a: Secondary | ICD-10-CM | POA: Diagnosis not present

## 2019-06-06 DIAGNOSIS — M1990 Unspecified osteoarthritis, unspecified site: Secondary | ICD-10-CM | POA: Diagnosis not present

## 2019-06-06 DIAGNOSIS — E041 Nontoxic single thyroid nodule: Secondary | ICD-10-CM | POA: Diagnosis not present

## 2019-06-06 DIAGNOSIS — E782 Mixed hyperlipidemia: Secondary | ICD-10-CM | POA: Diagnosis not present

## 2019-06-06 DIAGNOSIS — N529 Male erectile dysfunction, unspecified: Secondary | ICD-10-CM | POA: Diagnosis not present

## 2019-06-06 DIAGNOSIS — Z125 Encounter for screening for malignant neoplasm of prostate: Secondary | ICD-10-CM | POA: Diagnosis not present

## 2019-06-22 DIAGNOSIS — N179 Acute kidney failure, unspecified: Secondary | ICD-10-CM | POA: Diagnosis not present

## 2019-10-19 DIAGNOSIS — S61459A Open bite of unspecified hand, initial encounter: Secondary | ICD-10-CM | POA: Diagnosis not present

## 2019-10-19 DIAGNOSIS — Z23 Encounter for immunization: Secondary | ICD-10-CM | POA: Diagnosis not present

## 2019-10-23 ENCOUNTER — Encounter (HOSPITAL_COMMUNITY): Payer: Self-pay | Admitting: Emergency Medicine

## 2019-10-23 ENCOUNTER — Emergency Department (HOSPITAL_COMMUNITY): Payer: Medicare Other | Admitting: Anesthesiology

## 2019-10-23 ENCOUNTER — Emergency Department (HOSPITAL_COMMUNITY): Payer: Medicare Other

## 2019-10-23 ENCOUNTER — Encounter (HOSPITAL_COMMUNITY)
Admission: EM | Disposition: A | Discharge status: Other Acute Inpatient Hospital | Payer: Self-pay | Source: Home / Self Care | Attending: Emergency Medicine

## 2019-10-23 ENCOUNTER — Other Ambulatory Visit: Payer: Self-pay

## 2019-10-23 ENCOUNTER — Emergency Department (HOSPITAL_COMMUNITY)
Admission: EM | Admit: 2019-10-23 | Discharge: 2019-10-23 | Disposition: A | Discharge status: Other Acute Inpatient Hospital | Payer: Medicare Other | Attending: Emergency Medicine | Admitting: Emergency Medicine

## 2019-10-23 DIAGNOSIS — S68522A Partial traumatic transphalangeal amputation of left thumb, initial encounter: Secondary | ICD-10-CM | POA: Diagnosis present

## 2019-10-23 DIAGNOSIS — S62522B Displaced fracture of distal phalanx of left thumb, initial encounter for open fracture: Secondary | ICD-10-CM

## 2019-10-23 DIAGNOSIS — S62522A Displaced fracture of distal phalanx of left thumb, initial encounter for closed fracture: Secondary | ICD-10-CM | POA: Diagnosis not present

## 2019-10-23 DIAGNOSIS — I10 Essential (primary) hypertension: Secondary | ICD-10-CM | POA: Insufficient documentation

## 2019-10-23 DIAGNOSIS — Y929 Unspecified place or not applicable: Secondary | ICD-10-CM | POA: Diagnosis not present

## 2019-10-23 DIAGNOSIS — S6492XA Injury of unspecified nerve at wrist and hand level of left arm, initial encounter: Secondary | ICD-10-CM | POA: Diagnosis not present

## 2019-10-23 DIAGNOSIS — Z79899 Other long term (current) drug therapy: Secondary | ICD-10-CM | POA: Diagnosis not present

## 2019-10-23 DIAGNOSIS — S6702XA Crushing injury of left thumb, initial encounter: Secondary | ICD-10-CM | POA: Diagnosis not present

## 2019-10-23 DIAGNOSIS — S68512A Complete traumatic transphalangeal amputation of left thumb, initial encounter: Secondary | ICD-10-CM | POA: Diagnosis not present

## 2019-10-23 DIAGNOSIS — Z7982 Long term (current) use of aspirin: Secondary | ICD-10-CM | POA: Diagnosis not present

## 2019-10-23 DIAGNOSIS — Z20822 Contact with and (suspected) exposure to covid-19: Secondary | ICD-10-CM | POA: Insufficient documentation

## 2019-10-23 DIAGNOSIS — W231XXA Caught, crushed, jammed, or pinched between stationary objects, initial encounter: Secondary | ICD-10-CM | POA: Insufficient documentation

## 2019-10-23 DIAGNOSIS — Y9389 Activity, other specified: Secondary | ICD-10-CM | POA: Insufficient documentation

## 2019-10-23 DIAGNOSIS — S66202A Unspecified injury of extensor muscle, fascia and tendon of left thumb at wrist and hand level, initial encounter: Secondary | ICD-10-CM | POA: Diagnosis not present

## 2019-10-23 DIAGNOSIS — Y999 Unspecified external cause status: Secondary | ICD-10-CM | POA: Diagnosis not present

## 2019-10-23 DIAGNOSIS — S62512A Displaced fracture of proximal phalanx of left thumb, initial encounter for closed fracture: Secondary | ICD-10-CM | POA: Diagnosis not present

## 2019-10-23 HISTORY — PX: I & D EXTREMITY: SHX5045

## 2019-10-23 LAB — CBC WITH DIFFERENTIAL/PLATELET
Abs Immature Granulocytes: 0.02 10*3/uL (ref 0.00–0.07)
Basophils Absolute: 0 10*3/uL (ref 0.0–0.1)
Basophils Relative: 1 %
Eosinophils Absolute: 0.1 10*3/uL (ref 0.0–0.5)
Eosinophils Relative: 3 %
HCT: 42.6 % (ref 39.0–52.0)
Hemoglobin: 13.9 g/dL (ref 13.0–17.0)
Immature Granulocytes: 0 %
Lymphocytes Relative: 26 %
Lymphs Abs: 1.5 10*3/uL (ref 0.7–4.0)
MCH: 28.8 pg (ref 26.0–34.0)
MCHC: 32.6 g/dL (ref 30.0–36.0)
MCV: 88.4 fL (ref 80.0–100.0)
Monocytes Absolute: 0.6 10*3/uL (ref 0.1–1.0)
Monocytes Relative: 11 %
Neutro Abs: 3.3 10*3/uL (ref 1.7–7.7)
Neutrophils Relative %: 59 %
Platelets: 259 10*3/uL (ref 150–400)
RBC: 4.82 MIL/uL (ref 4.22–5.81)
RDW: 13.1 % (ref 11.5–15.5)
WBC: 5.6 10*3/uL (ref 4.0–10.5)
nRBC: 0 % (ref 0.0–0.2)

## 2019-10-23 LAB — BASIC METABOLIC PANEL
Anion gap: 12 (ref 5–15)
BUN: 28 mg/dL — ABNORMAL HIGH (ref 8–23)
CO2: 22 mmol/L (ref 22–32)
Calcium: 9.7 mg/dL (ref 8.9–10.3)
Chloride: 103 mmol/L (ref 98–111)
Creatinine, Ser: 1.4 mg/dL — ABNORMAL HIGH (ref 0.61–1.24)
GFR calc Af Amer: 59 mL/min — ABNORMAL LOW (ref >60)
GFR calc non Af Amer: 51 mL/min — ABNORMAL LOW (ref >60)
Glucose, Bld: 117 mg/dL — ABNORMAL HIGH (ref 70–99)
Potassium: 3.6 mmol/L (ref 3.5–5.1)
Sodium: 137 mmol/L (ref 135–145)

## 2019-10-23 LAB — SARS CORONAVIRUS 2 BY RT PCR (HOSPITAL ORDER, PERFORMED IN ~~LOC~~ HOSPITAL LAB): SARS Coronavirus 2: NEGATIVE

## 2019-10-23 SURGERY — IRRIGATION AND DEBRIDEMENT EXTREMITY
Anesthesia: General | Site: Thumb | Laterality: Left

## 2019-10-23 MED ORDER — OXYCODONE HCL 5 MG PO TABS: 5.0000 mg | ORAL_TABLET | ORAL | 0 refills | Status: AC | PRN

## 2019-10-23 MED ORDER — STERILE WATER FOR IRRIGATION IR SOLN
Status: DC | PRN
  Administered 2019-10-23: 1000 mL

## 2019-10-23 MED ORDER — SODIUM CHLORIDE 0.9 % IR SOLN
Status: DC | PRN
  Administered 2019-10-23: 1000 mL

## 2019-10-23 MED ORDER — FENTANYL CITRATE (PF) 250 MCG/5ML IJ SOLN
INTRAMUSCULAR | Status: DC | PRN
  Administered 2019-10-23 (×4): 25 ug via INTRAVENOUS
  Administered 2019-10-23: 50 ug via INTRAVENOUS

## 2019-10-23 MED ORDER — PHENYLEPHRINE HCL (PRESSORS) 10 MG/ML IV SOLN
INTRAVENOUS | Status: DC | PRN
  Administered 2019-10-23 (×6): 80 ug via INTRAVENOUS

## 2019-10-23 MED ORDER — CEPHALEXIN 500 MG PO CAPS: 500.0000 mg | ORAL_CAPSULE | Freq: Four times a day (QID) | ORAL | 0 refills | Status: AC

## 2019-10-23 MED ORDER — BUPIVACAINE HCL (PF) 0.25 % IJ SOLN
INTRAMUSCULAR | Status: DC | PRN
  Administered 2019-10-23: 30 mL

## 2019-10-23 MED ORDER — OXYCODONE HCL 5 MG PO TABS: 5.0000 mg | ORAL_TABLET | Freq: Once | ORAL | Status: DC | PRN

## 2019-10-23 MED ORDER — ONDANSETRON HCL 4 MG PO TABS: 4.0000 mg | ORAL_TABLET | Freq: Every day | ORAL | 1 refills | Status: AC | PRN

## 2019-10-23 MED ORDER — HYDROMORPHONE HCL 1 MG/ML IJ SOLN: 0.5000 mg | Freq: Once | INTRAMUSCULAR | Status: DC

## 2019-10-23 MED ORDER — ONDANSETRON HCL 4 MG/2ML IJ SOLN: 4.0000 mg | Freq: Once | INTRAMUSCULAR | Status: DC | PRN

## 2019-10-23 MED ORDER — FENTANYL CITRATE (PF) 100 MCG/2ML IJ SOLN
50.0000 ug | Freq: Once | INTRAMUSCULAR | Status: AC
  Administered 2019-10-23: 50 ug via INTRAVENOUS
  Filled 2019-10-23: qty 2

## 2019-10-23 MED ORDER — ONDANSETRON HCL 4 MG PO TABS: 4.0000 mg | ORAL_TABLET | Freq: Three times a day (TID) | ORAL | 1 refills | Status: AC | PRN

## 2019-10-23 MED ORDER — MIDAZOLAM HCL 2 MG/2ML IJ SOLN
INTRAMUSCULAR | Status: AC
  Filled 2019-10-23: qty 2

## 2019-10-23 MED ORDER — OXYCODONE HCL 5 MG/5ML PO SOLN: 5.0000 mg | Freq: Once | ORAL | Status: DC | PRN

## 2019-10-23 MED ORDER — BUPIVACAINE HCL (PF) 0.25 % IJ SOLN
INTRAMUSCULAR | Status: AC
  Filled 2019-10-23: qty 30

## 2019-10-23 MED ORDER — CEFAZOLIN SODIUM-DEXTROSE 1-4 GM/50ML-% IV SOLN
INTRAVENOUS | Status: DC | PRN
  Administered 2019-10-23: 1 g via INTRAVENOUS

## 2019-10-23 MED ORDER — SODIUM CHLORIDE 0.9 % IR SOLN
Status: DC | PRN
  Administered 2019-10-23: 3000 mL

## 2019-10-23 MED ORDER — PROPOFOL 10 MG/ML IV BOLUS
INTRAVENOUS | Status: AC
  Filled 2019-10-23: qty 20

## 2019-10-23 MED ORDER — CEFAZOLIN SODIUM-DEXTROSE 1-4 GM/50ML-% IV SOLN
1.0000 g | Freq: Once | INTRAVENOUS | Status: AC
  Administered 2019-10-23: 1 g via INTRAVENOUS
  Filled 2019-10-23: qty 50

## 2019-10-23 MED ORDER — ONDANSETRON HCL 4 MG/2ML IJ SOLN
INTRAMUSCULAR | Status: AC
  Filled 2019-10-23: qty 2

## 2019-10-23 MED ORDER — DEXAMETHASONE SODIUM PHOSPHATE 10 MG/ML IJ SOLN
INTRAMUSCULAR | Status: AC
  Filled 2019-10-23: qty 1

## 2019-10-23 MED ORDER — PHENYLEPHRINE HCL-NACL 10-0.9 MG/250ML-% IV SOLN
INTRAVENOUS | Status: DC | PRN
Start: 2019-10-23 — End: 2019-10-23
  Administered 2019-10-23: 40 ug/min via INTRAVENOUS

## 2019-10-23 MED ORDER — ONDANSETRON HCL 4 MG/2ML IJ SOLN
INTRAMUSCULAR | Status: DC | PRN
  Administered 2019-10-23: 4 mg via INTRAVENOUS

## 2019-10-23 MED ORDER — FENTANYL CITRATE (PF) 100 MCG/2ML IJ SOLN: 25.0000 ug | INTRAMUSCULAR | Status: DC | PRN

## 2019-10-23 MED ORDER — LACTATED RINGERS IV SOLN: INTRAVENOUS | Status: DC | PRN

## 2019-10-23 MED ORDER — FENTANYL CITRATE (PF) 250 MCG/5ML IJ SOLN
INTRAMUSCULAR | Status: AC
  Filled 2019-10-23: qty 5

## 2019-10-23 MED ORDER — MIDAZOLAM HCL 2 MG/2ML IJ SOLN
INTRAMUSCULAR | Status: DC | PRN
  Administered 2019-10-23: 2 mg via INTRAVENOUS

## 2019-10-23 MED ORDER — PROPOFOL 10 MG/ML IV BOLUS
INTRAVENOUS | Status: DC | PRN
  Administered 2019-10-23: 160 mg via INTRAVENOUS

## 2019-10-23 MED ORDER — LIDOCAINE HCL (CARDIAC) PF 100 MG/5ML IV SOSY
PREFILLED_SYRINGE | INTRAVENOUS | Status: DC | PRN
  Administered 2019-10-23: 60 mg via INTRAVENOUS

## 2019-10-23 MED ORDER — DEXAMETHASONE SODIUM PHOSPHATE 10 MG/ML IJ SOLN
INTRAMUSCULAR | Status: DC | PRN
  Administered 2019-10-23: 10 mg via INTRAVENOUS

## 2019-10-23 SURGICAL SUPPLY — 47 items
BNDG CONFORM 2 STRL LF (GAUZE/BANDAGES/DRESSINGS) IMPLANT
BNDG CONFORM 3 STRL LF (GAUZE/BANDAGES/DRESSINGS) ×3 IMPLANT
BNDG ELASTIC 3X5.8 VLCR STR LF (GAUZE/BANDAGES/DRESSINGS) ×3 IMPLANT
BNDG ELASTIC 4X5.8 VLCR STR LF (GAUZE/BANDAGES/DRESSINGS) IMPLANT
BNDG GAUZE ELAST 4 BULKY (GAUZE/BANDAGES/DRESSINGS) ×3 IMPLANT
CORD BIPOLAR FORCEPS 12FT (ELECTRODE) ×3 IMPLANT
COVER SURGICAL LIGHT HANDLE (MISCELLANEOUS) ×3 IMPLANT
COVER WAND RF STERILE (DRAPES) IMPLANT
CUFF TOURN SGL QUICK 18X4 (TOURNIQUET CUFF) ×3 IMPLANT
CUFF TOURN SGL QUICK 24 (TOURNIQUET CUFF)
CUFF TRNQT CYL 24X4X16.5-23 (TOURNIQUET CUFF) IMPLANT
DRSG ADAPTIC 3X8 NADH LF (GAUZE/BANDAGES/DRESSINGS) ×3 IMPLANT
GAUZE SPONGE 4X4 12PLY STRL (GAUZE/BANDAGES/DRESSINGS) IMPLANT
GAUZE SPONGE 4X4 12PLY STRL LF (GAUZE/BANDAGES/DRESSINGS) ×3 IMPLANT
GAUZE XEROFORM 1X8 LF (GAUZE/BANDAGES/DRESSINGS) IMPLANT
GAUZE XEROFORM 5X9 LF (GAUZE/BANDAGES/DRESSINGS) ×3 IMPLANT
GLOVE BIO SURGEON STRL SZ 6.5 (GLOVE) ×2 IMPLANT
GLOVE BIO SURGEONS STRL SZ 6.5 (GLOVE) ×1
GLOVE BIOGEL M 8.0 STRL (GLOVE) ×3 IMPLANT
GLOVE SS BIOGEL STRL SZ 8 (GLOVE) ×1 IMPLANT
GLOVE SUPERSENSE BIOGEL SZ 8 (GLOVE) ×2
GOWN STRL REUS W/ TWL LRG LVL3 (GOWN DISPOSABLE) ×1 IMPLANT
GOWN STRL REUS W/ TWL XL LVL3 (GOWN DISPOSABLE) ×2 IMPLANT
GOWN STRL REUS W/TWL LRG LVL3 (GOWN DISPOSABLE) ×3
GOWN STRL REUS W/TWL XL LVL3 (GOWN DISPOSABLE) ×6
KIT BASIN OR (CUSTOM PROCEDURE TRAY) ×3 IMPLANT
KIT TURNOVER KIT B (KITS) ×3 IMPLANT
MANIFOLD NEPTUNE II (INSTRUMENTS) ×3 IMPLANT
NEEDLE HYPO 25GX1X1/2 BEV (NEEDLE) ×3 IMPLANT
NS IRRIG 1000ML POUR BTL (IV SOLUTION) ×3 IMPLANT
PACK ORTHO EXTREMITY (CUSTOM PROCEDURE TRAY) ×3 IMPLANT
PAD ARMBOARD 7.5X6 YLW CONV (MISCELLANEOUS) ×3 IMPLANT
PAD CAST 4YDX4 CTTN HI CHSV (CAST SUPPLIES) ×1 IMPLANT
PADDING CAST COTTON 4X4 STRL (CAST SUPPLIES) ×3
SET CYSTO W/LG BORE CLAMP LF (SET/KITS/TRAYS/PACK) ×6 IMPLANT
SOL PREP POV-IOD 4OZ 10% (MISCELLANEOUS) ×3 IMPLANT
SOL PREP PROV IODINE SCRUB 4OZ (MISCELLANEOUS) ×3 IMPLANT
SPONGE LAP 4X18 RFD (DISPOSABLE) IMPLANT
SUT CHROMIC 5 0 P 3 (SUTURE) ×6 IMPLANT
SWAB CULTURE ESWAB REG 1ML (MISCELLANEOUS) IMPLANT
SYR CONTROL 10ML LL (SYRINGE) ×3 IMPLANT
TOWEL GREEN STERILE (TOWEL DISPOSABLE) ×3 IMPLANT
TOWEL GREEN STERILE FF (TOWEL DISPOSABLE) ×3 IMPLANT
TUBE CONNECTING 12'X1/4 (SUCTIONS) ×1
TUBE CONNECTING 12X1/4 (SUCTIONS) ×2 IMPLANT
WATER STERILE IRR 1000ML POUR (IV SOLUTION) ×3 IMPLANT
YANKAUER SUCT BULB TIP NO VENT (SUCTIONS) ×3 IMPLANT

## 2019-10-23 NOTE — H&P (Signed)
Joshua Durham is an 69 y.o. male.   Chief Complaint: Left thumb open fracture with decreased viability described soft tissue including tendon bone vessel nerve and associated structures. HPI: Severe industrial injury with forearm type equipment sustained today.  He has near amputation to his thumb.  Unfortunately he has advanced disarray the soft tissue including bone tendon nerve and vessel injury.  This may certainly be a nonsalvageable issue in my opinion.  I discussed with patient the findings.  He is 69 years of age.  He is fairly active.  He would like to proceed with attempted reconstruction versus completion amputation is necessary.  I discussed these issues with him at length and the findings.  Past Medical History:  Diagnosis Date  . Hypertension     History reviewed. No pertinent surgical history.  History reviewed. No pertinent family history. Social History:  reports that he has never smoked. He has never used smokeless tobacco. He reports that he does not drink alcohol and does not use drugs.  Allergies: No Known Allergies  (Not in a hospital admission)   Results for orders placed or performed during the hospital encounter of 10/23/19 (from the past 48 hour(s))  CBC with Differential     Status: None   Collection Time: 10/23/19  5:40 PM  Result Value Ref Range   WBC 5.6 4.0 - 10.5 K/uL   RBC 4.82 4.22 - 5.81 MIL/uL   Hemoglobin 13.9 13.0 - 17.0 g/dL   HCT 42.6 39 - 52 %   MCV 88.4 80.0 - 100.0 fL   MCH 28.8 26.0 - 34.0 pg   MCHC 32.6 30.0 - 36.0 g/dL   RDW 13.1 11.5 - 15.5 %   Platelets 259 150 - 400 K/uL   nRBC 0.0 0.0 - 0.2 %   Neutrophils Relative % 59 %   Neutro Abs 3.3 1.7 - 7.7 K/uL   Lymphocytes Relative 26 %   Lymphs Abs 1.5 0.7 - 4.0 K/uL   Monocytes Relative 11 %   Monocytes Absolute 0.6 0 - 1 K/uL   Eosinophils Relative 3 %   Eosinophils Absolute 0.1 0 - 0 K/uL   Basophils Relative 1 %   Basophils Absolute 0.0 0 - 0 K/uL   Immature  Granulocytes 0 %   Abs Immature Granulocytes 0.02 0.00 - 0.07 K/uL    Comment: Performed at [facility], [address], [place], [place] [address]  Basic metabolic panel     Status: Abnormal   Collection Time: 10/23/19  5:40 PM  Result Value Ref Range   Sodium 137 135 - 145 mmol/L   Potassium 3.6 3.5 - 5.1 mmol/L   Chloride 103 98 - 111 mmol/L   CO2 22 22 - 32 mmol/L   Glucose, Bld 117 (H) 70 - 99 mg/dL    Comment: Glucose reference range applies only to samples taken after fasting for at least 8 hours.   BUN 28 (H) 8 - 23 mg/dL   Creatinine, Ser 1.40 (H) 0.61 - 1.24 mg/dL   Calcium 9.7 8.9 - 10.3 mg/dL   GFR calc non Af Amer 51 (L) >60 mL/min   GFR calc Af Amer 59 (L) >60 mL/min   Anion gap 12 5 - 15    Comment: Performed at [facility] [address] [place], [place] [address]  SARS Coronavirus 2 by RT PCR (hospital order, performed in [facility] hospital lab) Nasopharyngeal Nasopharyngeal Swab     Status: None  Collection Time: 10/23/19  5:59 PM   Specimen: Nasopharyngeal Swab  Result Value Ref Range   SARS Coronavirus 2 NEGATIVE NEGATIVE    Comment: (NOTE) SARS-CoV-2 target nucleic acids are NOT DETECTED.  The SARS-CoV-2 RNA is generally detectable in upper and lower respiratory specimens during the acute phase of infection. The lowest concentration of SARS-CoV-2 viral copies this assay can detect is 250 copies / mL. A negative result does not preclude SARS-CoV-2 infection and should not be used as the sole basis for treatment or other patient management decisions.  A negative result may occur with improper specimen collection / handling, submission of specimen other than nasopharyngeal swab, presence of viral mutation(s) within the areas targeted by this assay, and inadequate number of viral copies (<250 copies / mL). A negative result must be combined with clinical observations, patient history, and epidemiological information.  Fact Sheet for Patients:    StrictlyIdeas.no  Fact Sheet for Healthcare Providers: BankingDealers.co.za  This test is not yet approved or  cleared by the [place] FDA and has been authorized for detection and/or diagnosis of SARS-CoV-2 by FDA under an Emergency Use Authorization (EUA).  This EUA will remain in effect (meaning this test can be used) for the duration of the COVID-19 declaration under Section 564(b)(1) of the Act, 21 U.S.C. section 360bbb-3(b)(1), unless the authorization is terminated or revoked sooner.  Performed at [facility], [place] [address] [place], [place] [address]    DG Finger Thumb Left  Result Date: 10/23/2019 CLINICAL DATA:  Partial amputation EXAM: LEFT THUMB 2+V COMPARISON:  None. FINDINGS: Acute comminuted open fracture involving the head and distal shaft of the first proximal phalanx. Degenerative changes at the first [facility] joint. IMPRESSION: Acute comminuted open fracture involving the head and distal shaft of the first proximal phalanx. Electronically Signed   By: Donavan Foil M.D.   On: 10/23/2019 18:02    Review of Systems  Blood pressure (!) 141/72, pulse 80, temperature 98.3 F (36.8 C), temperature source Oral, resp. rate 18, height 5\' 10"  (1.778 m), weight 88.9 kg, SpO2 100 %. Physical Exam  Near amputation with dysvascular distal tip left thumb.  Tendon bone and soft tissue disarray is apparent as well as nerve and vessel injury.  The patient is alert and oriented in no acute distress. The patient complains of pain in the affected upper extremity.  The patient is noted to have a normal HEENT exam. Lung fields show equal chest expansion and no shortness of breath. Abdomen exam is nontender without distention. Lower extremity examination does not show any fracture dislocation or blood clot symptoms. Pelvis is stable and the neck and back are stable and nontender. Assessment/Plan Near amputation with decreased  viability left thumb after a significant crushing injury today.  We will plan for irrigation debridement repair is necessary versus completion amputation/revision amputation.  Patient understands this.  We will move forward accordingly.  We are planning surgery for your upper extremity. The risk and benefits of surgery to include risk of bleeding, infection, anesthesia,  damage to normal structures and failure of the surgery to accomplish its intended goals of relieving symptoms and restoring function have been discussed in detail. With this in mind we plan to proceed. I have specifically discussed with the patient the pre-and postoperative regime and the dos and don'ts and risk and benefits in great detail. Risk and benefits of surgery also include risk of dystrophy(CRPS), chronic nerve pain, failure of the healing process to go onto  completion and other inherent risks of surgery The relavent the pathophysiology of the disease/injury process, as well as the alternatives for treatment and postoperative course of action has been discussed in great detail with the patient who desires to proceed.  We will do everything in our power to help you (the patient) restore function to the upper extremity. It is a pleasure to see this patient today.   Willa Frater III, MD 10/23/2019, 8:49 PM

## 2019-10-23 NOTE — Anesthesia Procedure Notes (Signed)
Procedure Name: LMA Insertion Date/Time: 10/23/2019 9:08 PM Performed by: Valetta Fuller, CRNA Pre-anesthesia Checklist: Patient identified, Emergency Drugs available, Suction available, Patient being monitored and Timeout performed Patient Re-evaluated:Patient Re-evaluated prior to induction Oxygen Delivery Method: Circle system utilized Preoxygenation: Pre-oxygenation with 100% oxygen Induction Type: IV induction Ventilation: Mask ventilation without difficulty LMA: LMA inserted LMA Size: 4.0 Number of attempts: 1 Placement Confirmation: positive ETCO2 and breath sounds checked- equal and bilateral Tube secured with: Tape Dental Injury: Teeth and Oropharynx as per pre-operative assessment

## 2019-10-23 NOTE — ED Triage Notes (Signed)
Pt to ED via POV.  St's he got his thumb mashed between a tractor and farm equipment.  Pt has almost complete amputation to left thumb.  Bleeding controlled at this time

## 2019-10-23 NOTE — Transfer of Care (Signed)
Immediate Anesthesia Transfer of Care Note  Patient: Joshua Durham  Procedure(s) Performed: #1 irrigation debridement skin subtenons tissue bone tendon and associated soft tissue left thumb secondary to near amputation with loss of viability to the thumb #2 completion amputation with bilateral neurectomies, volar advancement flap and repair reconstruction left thumb (Left Thumb)  Patient Location: PACU  Anesthesia Type:General  Level of Consciousness: awake, alert  and oriented  Airway & Oxygen Therapy: Patient Spontanous Breathing  Post-op Assessment: Report given to RN and Post -op Vital signs reviewed and stable  Post vital signs: Reviewed and stable  Last Vitals:  Vitals Value Taken Time  BP 125/60 10/23/19 2224  Temp 36.8 C 10/23/19 2209  Pulse 76 10/23/19 2239  Resp 16 10/23/19 2239  SpO2 99 % 10/23/19 2239    Last Pain:  Vitals:   10/23/19 2239  TempSrc:   PainSc: 0-No pain         Complications: No complications documented.

## 2019-10-23 NOTE — Discharge Instructions (Signed)
Please keep your bandage clean and dry.  Please finish out the antibiotics you already have an intake at least 7 days of the cephalexin/Keflex antibiotic that I prescribed for you.  Your pain medicine will be at your pharmacy.  Take this as needed.  Please call for any problems  Keep bandage clean and dry.  Call for any problems.  No smoking.  Criteria for driving a car: you should be off your pain medicine for 7-8 hours, able to drive one handed(confident), thinking clearly and feeling able in your judgement to drive. Continue elevation as it will decrease swelling.  If instructed by MD move your fingers within the confines of the bandage/splint.  Use ice if instructed by your MD. Call immediately for any sudden loss of feeling in your hand/arm or change in functional abilities of the extremity.We recommend that you to take vitamin C 1000 mg a day to promote healing. We also recommend that if you require  pain medicine that you take a stool softener to prevent constipation as most pain medicines will have constipation side effects. We recommend either Peri-Colace or Senokot and recommend that you also consider adding MiraLAX as well to prevent the constipation affects from pain medicine if you are required to use them. These medicines are over the counter and may be purchased at a local pharmacy. A cup of yogurt and a probiotic can also be helpful during the recovery process as the medicines can disrupt your intestinal environment.

## 2019-10-23 NOTE — Op Note (Signed)
Operative note 10/23/2019  Roseanne Kaufman MD.  Preoperative diagnosis left near complete amputation with segmental bone with  flexor and extensor tendon injury, nerve and vessel injury and disarray the soft tissues  Postop diagnosis: The same  Operative procedure #1 irrigation debridement skin subtenons tissue bone tendon and associated soft tissue left thumb secondary to near amputation with loss of viability to the thumb #2 completion amputation with bilateral neurectomies, volar advancement flap and repair reconstruction left thumb  Surgeon Roseanne Kaufman  Anesthesia General.  Estimated blood loss minimal.  Indications and procedure: Patient was seen by myself he was counseled in regards to the devastating injury he sustained.  He was prepped for the above-mentioned surgical intervention as he does not appear to have a viable digit.  In the operative theater he underwent a very careful and cautious approach to the extremity with Hibiclens followed by Betadine scrub and paint performed by myself.  I assessed the viability throughout the procedure and noted that the thumb did not have good viability/was dysvascular.  It was quite apparent given the segmental injury disarray the soft tissues and loss of blood flow this would not be salvageable digit.  At this time we performed irrigation debridement of skin subtenons tissue bone tendon and associated soft tissues.  At this time given the fact that this would require a amputation completion I then performed identification of the neurovascular bundles which were identified proximally where the had been injured and I then performed bilateral neurectomies.  The EPL and FPL were debrided.  They were injured as well.  Both the extensor and flexor apparatuses were debrided and bilateral neurectomies performed.  Bone was sculpted and a volar flap was sculpted to allow for soft tissue coverage.  After this we then performed irrigation with 4 L of  saline.  Following this securing hemostasis was paramount and we did so with bipolar cautery.  Following this we then performed aggressive additional look to make sure all areas looked well and had stable features and viability to the skin flaps.  At this time I then performed a volar advancement flap with suturing utilizing chromic suture of the 4 oh and 5 oh variety.  Once again the patient had good viability to the volar flap.  This was noted with tourniquet deflated.  Unfortunately, this was not a reconstruct we will digit.  This required irrigation debridement and completion amputation with bilateral neurectomies and volar advancement flap.  He will be discharged home on oxycodone Keflex and Zofran he will return to see me in the office in 10 to 14 days.  All questions have been encouraged and answered.  Fidel Caggiano MD

## 2019-10-23 NOTE — Anesthesia Preprocedure Evaluation (Addendum)
Anesthesia Evaluation  Patient identified by MRN, date of birth, ID band Patient awake    Reviewed: Allergy & Precautions, NPO status , Patient's Chart, lab work & pertinent test results  History of Anesthesia Complications Negative for: history of anesthetic complications  Airway Mallampati: II  TM Distance: >3 FB Neck ROM: Full    Dental  (+) Dental Advisory Given, Teeth Intact   Pulmonary neg pulmonary ROS,    Pulmonary exam normal        Cardiovascular hypertension, Pt. on home beta blockers and Pt. on medications Normal cardiovascular exam     Neuro/Psych negative neurological ROS  negative psych ROS   GI/Hepatic negative GI ROS, Neg liver ROS,   Endo/Other  negative endocrine ROS  Renal/GU Renal InsufficiencyRenal disease     Musculoskeletal negative musculoskeletal ROS (+)   Abdominal   Peds  Hematology negative hematology ROS (+)   Anesthesia Other Findings Covid test negative   Reproductive/Obstetrics                            Anesthesia Physical Anesthesia Plan  ASA: II  Anesthesia Plan: General   Post-op Pain Management:    Induction: Intravenous  PONV Risk Score and Plan: 2 and Treatment may vary due to age or medical condition, Ondansetron and Dexamethasone  Airway Management Planned: LMA  Additional Equipment: None  Intra-op Plan:   Post-operative Plan: Extubation in OR  Informed Consent: I have reviewed the patients History and Physical, chart, labs and discussed the procedure including the risks, benefits and alternatives for the proposed anesthesia with the patient or authorized representative who has indicated his/her understanding and acceptance.     Dental advisory given  Plan Discussed with: CRNA, Anesthesiologist and Surgeon  Anesthesia Plan Comments:        Anesthesia Quick Evaluation

## 2019-10-23 NOTE — ED Notes (Signed)
Pt's  Wife Remo Lipps [phone]

## 2019-10-23 NOTE — ED Provider Notes (Addendum)
[facility] EMERGENCY DEPARTMENT Provider Note   CSN: [id number] Arrival date & time: 10/23/19  1700     History Chief Complaint  Patient presents with  . Partial thumb amputation    Joshua Durham is a 69 y.o. male history of hypertension otherwise healthy.  Presents today for left thumb injury that occurred just prior to arrival he was working on some farm equipment when he got caught in the machine.  He suffered near complete amputation of his left thumb, pain severe and immediate nonradiating worsened with palpation, sharp throbbing in nature.  Bleeding controlled with direct pressure prior to arrival.  Denies any other injuries or concerns.  He reports his tetanus shot was updated last week after an animal bite  HPI     Past Medical History:  Diagnosis Date  . Hypertension     Patient Active Problem List   Diagnosis Date Noted  . Left scapula fracture 01/04/2017    History reviewed. No pertinent surgical history.     No family history on file.  Social History   Tobacco Use  . Smoking status: Never Smoker  . Smokeless tobacco: Never Used  Vaping Use  . Vaping Use: Never used  Substance Use Topics  . Alcohol use: No  . Drug use: No    Home Medications Prior to Admission medications   Medication Sig Start Date End Date Taking? Authorizing Provider  acetaminophen (TYLENOL) 650 MG CR tablet Take 650-1,300 mg by mouth every 8 (eight) hours as needed for pain.     [provider]  aspirin EC 81 MG tablet Take 81 mg by mouth at bedtime.    [provider]  atenolol (TENORMIN) 50 MG tablet Take 50 mg by mouth daily. 11/08/16   [provider]  cephALEXin (KEFLEX) 500 MG capsule Take 1 capsule (500 mg total) by mouth 3 (three) times daily. Patient not taking: Reported on 04/12/2017 01/14/17   Lacretia Leigh, MD  chlorthalidone (HYGROTON) 25 MG tablet Take 25 mg by mouth daily with breakfast. 11/08/16   [provider]  Choline Fenofibrate (FENOFIBRIC ACID) 135 MG CPDR Take 135 mg by mouth daily. 11/11/16   [provider]  COLCRYS 0.6 MG tablet Take 0.6 mg by mouth 3 (three) times daily as needed (for gout flares).  11/23/16   [provider]  diclofenac sodium (VOLTAREN) 1 % GEL APPLY 2 GRAMS PER UPPER EXTREMITY JOINT EVERY 6 HOURS 04/27/17   [provider]  finasteride (PROSCAR) 5 MG tablet Take 5 mg by mouth daily. 03/20/17   [provider]  lisinopril (PRINIVIL,ZESTRIL) 5 MG tablet Take 5 mg by mouth at bedtime. 11/03/16   [provider]  methocarbamol (ROBAXIN) 750 MG tablet Take 1 tablet (750 mg total) by mouth every 8 (eight) hours as needed for muscle spasms. Patient not taking: Reported on 04/12/2017 01/07/17   Norm Parcel, PA-C  niacin (NIASPAN) 1000 MG CR tablet Take 1,000 mg by mouth at bedtime.  10/20/16   [provider]  Omega-3 Fatty Acids (FISH OIL) 1000 MG CAPS Take 1,000-2,000 mg by mouth See admin instructions. 2,000 mg in the morning and 1,000 mg in the evening    [provider]  potassium chloride SA (K-DUR,KLOR-CON) 20 MEQ tablet Take 20 mEq by mouth daily with breakfast.    [provider]  simvastatin (ZOCOR) 40 MG tablet Take 40 mg by mouth at bedtime.    [provider]  tamsulosin [facility])  0.4 MG CAPS capsule Take 0.4 mg by mouth daily. 01/20/17   [provider]  traMADol (ULTRAM) 50 MG tablet Take 1 tablet (50 mg total) by mouth every 12 (twelve) hours as needed for moderate pain. for pain Patient not taking: Reported on 10/12/2017 09/08/17   [name], PA-C    Allergies    Patient has no known allergies.  Review of Systems   Review of Systems Ten systems are reviewed and are negative for acute change except as noted in the HPI  Physical Exam Updated Vital Signs BP (!) 141/72 (BP Location: Right Arm)   Pulse 80   Temp 98.3 F (36.8 C) (Oral)   Resp 18   Ht 5\' 10"  (1.778 m)   Wt  88.9 kg   SpO2 100%   BMI 28.12 kg/m   Physical Exam Constitutional:      General: He is not in acute distress.    Appearance: Normal appearance. He is well-developed. He is not ill-appearing or diaphoretic.  HENT:     Head: Normocephalic and atraumatic.  Eyes:     General: Vision grossly intact. Gaze aligned appropriately.     Pupils: Pupils are equal, round, and reactive to light.  Neck:     Trachea: Trachea and phonation normal.  Pulmonary:     Effort: Pulmonary effort is normal. No respiratory distress.  Abdominal:     General: There is no distension.     Palpations: Abdomen is soft.     Tenderness: There is no abdominal tenderness. There is no guarding or rebound.  Musculoskeletal:        General: Normal range of motion.     Cervical back: Normal range of motion.     Comments: Left hand: Open fracture of the left thumb at the proximal phalanx near complete, thumb hanging off.  Capillary refill and sensation intact distal to injury.  No movement at the DIP.  Otherwise hand appears normal.  Strong equal radial pulses, good motion at the wrist without pain.  Skin:    General: Skin is warm and dry.  Neurological:     Mental Status: He is alert.     GCS: GCS eye subscore is 4. GCS verbal subscore is 5. GCS motor subscore is 6.     Comments: Speech is clear and goal oriented, follows commands Major Cranial nerves without deficit, no facial droop Moves extremities without ataxia, coordination intact  Psychiatric:        Behavior: Behavior normal.     ED Results / Procedures / Treatments   Labs (all labs ordered are listed, but only abnormal results are displayed) Labs Reviewed  BASIC METABOLIC PANEL - Abnormal; Notable for the following components:      Result Value   Glucose, Bld 117 (*)    BUN 28 (*)    Creatinine, Ser 1.40 (*)    GFR calc non Af Amer 51 (*)    GFR calc Af Amer 59 (*)    All other components within normal limits  SARS CORONAVIRUS 2 BY RT PCR  [facility] ORDER, [place] [facility])  CBC WITH DIFFERENTIAL/PLATELET    EKG EKG Interpretation  Date/Time:  Tuesday October 23 2019 18:47:50 EDT Ventricular Rate:  72 PR Interval:    QRS Duration: 109 QT Interval:  411 QTC Calculation: 450 R Axis:   58 Text Interpretation: Sinus rhythm Borderline low voltage, extremity leads Similar to 2018 tracing No STEMI Confirmed by Nanda Quinton [phone]) on  10/23/2019 6:54:12 PM   Radiology DG Finger Thumb Left  Result Date: 10/23/2019 CLINICAL DATA:  Partial amputation EXAM: LEFT THUMB 2+V COMPARISON:  None. FINDINGS: Acute comminuted open fracture involving the head and distal shaft of the first proximal phalanx. Degenerative changes at the first [facility] joint. IMPRESSION: Acute comminuted open fracture involving the head and distal shaft of the first proximal phalanx. Electronically Signed   By: Donavan Foil M.D.   On: 10/23/2019 18:02    Procedures Procedures (including critical care time)  Medications Ordered in ED Medications  HYDROmorphone (DILAUDID) injection 0.5 mg (has no administration in time range)  ceFAZolin (ANCEF) IVPB 1 g/50 mL premix (0 g Intravenous Stopped 10/23/19 1853)  fentaNYL (SUBLIMAZE) injection 50 mcg (50 mcg Intravenous Given 10/23/19 1751)    ED Course  I have reviewed the triage vital signs and the nursing notes.  Pertinent labs & imaging results that were available during my care of the patient were reviewed by me and considered in my medical decision making (see chart for details).    MDM Rules/Calculators/A&P                         Additional History Obtained from: 1. Nursing notes from this visit. 2. Family, patient's wife. ------------------ 72-year-old male presents today following injury of the left thumb occurred just prior to arrival.  Obvious open fracture present.  He is good capillary refill and sensation distal to injury.  Will obtain x-ray and give empiric Ancef.  Tdap up-to-date  per patient.  Pain medication ordered.  Will obtain basic labs with CBC, BMP and Covid test.  Consult placed to hand surgery. - DG left thumb:  IMPRESSION:  Acute comminuted open fracture involving the head and distal shaft  of the first proximal phalanx.  I personally reviewed patient's left thumb x-ray and agree with radiologist interpretation.  I ordered, reviewed and interpreted labs which include: CBC within normal limits, no leukocytosis to suggest infection and no anemia. BMP shows baseline kidney function with creatinine 1.4, no emergent electrolyte derangement or gap. ---------- Patient seen and evaluated by Dr. Amedeo Plenty, plans to take patient to ER tonight.  He has asked for addition of preop EKG.  EKG: Sinus rhythm Borderline low voltage, extremity leads Similar to 2018 tracing No STEMI Confirmed by Nanda Quinton [phone]) on 10/23/2019 6:54:12 PM - Patient reevaluated, resting comfortably in bed no acute distress.  Patient states understanding of care plan and is agreeable for going to the OR later on tonight.  Pain has returned after fentanyl wore off, Dilaudid ordered for pain control.    Covid test negative.  Case discussed with Dr. Laverta Baltimore during this visit.  Note: Portions of this report may have been transcribed using voice recognition software. Every effort was made to ensure accuracy; however, inadvertent computerized transcription errors may still be present. Final Clinical Impression(s) / ED Diagnoses Final diagnoses:  Open displaced fracture of distal phalanx of left thumb, initial encounter    Rx / DC Orders ED Discharge Orders    None         Gari Crown 10/23/19 1926    Margette Fast, MD 10/23/19 2336

## 2019-10-24 ENCOUNTER — Encounter (HOSPITAL_COMMUNITY): Payer: Self-pay | Admitting: Orthopedic Surgery

## 2019-10-24 NOTE — Anesthesia Postprocedure Evaluation (Signed)
Anesthesia Post Note  Patient: Joshua Durham  Procedure(s) Performed: #1 irrigation debridement skin subtenons tissue bone tendon and associated soft tissue left thumb secondary to near amputation with loss of viability to the thumb #2 completion amputation with bilateral neurectomies, volar advancement flap and repair reconstruction left thumb (Left Thumb)     Patient location during evaluation: PACU Anesthesia Type: General Level of consciousness: awake and alert Pain management: pain level controlled Vital Signs Assessment: post-procedure vital signs reviewed and stable Respiratory status: spontaneous breathing, nonlabored ventilation and respiratory function stable Cardiovascular status: blood pressure returned to baseline and stable Postop Assessment: no apparent nausea or vomiting Anesthetic complications: no   No complications documented.  Last Vitals:  Vitals:   10/23/19 2229 10/23/19 2239  BP:    Pulse: 74 76  Resp: 13 16  Temp:    SpO2: 96% 99%    Last Pain:  Vitals:   10/23/19 2239  TempSrc:   PainSc: 0-No pain                 Audry Pili

## 2020-01-09 DIAGNOSIS — Z23 Encounter for immunization: Secondary | ICD-10-CM | POA: Diagnosis not present

## 2020-01-17 DIAGNOSIS — Z23 Encounter for immunization: Secondary | ICD-10-CM | POA: Diagnosis not present

## 2020-02-22 DIAGNOSIS — N401 Enlarged prostate with lower urinary tract symptoms: Secondary | ICD-10-CM | POA: Diagnosis not present

## 2020-02-22 DIAGNOSIS — R3912 Poor urinary stream: Secondary | ICD-10-CM | POA: Diagnosis not present

## 2020-06-16 DIAGNOSIS — E041 Nontoxic single thyroid nodule: Secondary | ICD-10-CM | POA: Diagnosis not present

## 2020-06-16 DIAGNOSIS — R7301 Impaired fasting glucose: Secondary | ICD-10-CM | POA: Diagnosis not present

## 2020-06-16 DIAGNOSIS — I129 Hypertensive chronic kidney disease with stage 1 through stage 4 chronic kidney disease, or unspecified chronic kidney disease: Secondary | ICD-10-CM | POA: Diagnosis not present

## 2020-06-16 DIAGNOSIS — N529 Male erectile dysfunction, unspecified: Secondary | ICD-10-CM | POA: Diagnosis not present

## 2020-06-16 DIAGNOSIS — N183 Chronic kidney disease, stage 3 unspecified: Secondary | ICD-10-CM | POA: Diagnosis not present

## 2020-06-16 DIAGNOSIS — M1990 Unspecified osteoarthritis, unspecified site: Secondary | ICD-10-CM | POA: Diagnosis not present

## 2020-06-16 DIAGNOSIS — N400 Enlarged prostate without lower urinary tract symptoms: Secondary | ICD-10-CM | POA: Diagnosis not present

## 2020-06-16 DIAGNOSIS — Z0000 Encounter for general adult medical examination without abnormal findings: Secondary | ICD-10-CM | POA: Diagnosis not present

## 2020-06-16 DIAGNOSIS — M109 Gout, unspecified: Secondary | ICD-10-CM | POA: Diagnosis not present

## 2020-06-16 DIAGNOSIS — E782 Mixed hyperlipidemia: Secondary | ICD-10-CM | POA: Diagnosis not present

## 2020-06-16 DIAGNOSIS — Z89012 Acquired absence of left thumb: Secondary | ICD-10-CM | POA: Diagnosis not present

## 2020-08-13 ENCOUNTER — Ambulatory Visit (HOSPITAL_COMMUNITY)
Admission: RE | Admit: 2020-08-13 | Discharge: 2020-08-13 | Disposition: A | Discharge status: Home / Self Care | Payer: Medicare Other | Source: Ambulatory Visit | Attending: Family Medicine | Admitting: Family Medicine

## 2020-08-13 ENCOUNTER — Other Ambulatory Visit: Payer: Self-pay

## 2020-08-13 ENCOUNTER — Other Ambulatory Visit (HOSPITAL_COMMUNITY): Payer: Self-pay | Admitting: Family Medicine

## 2020-08-13 DIAGNOSIS — M7989 Other specified soft tissue disorders: Secondary | ICD-10-CM | POA: Insufficient documentation

## 2020-08-13 DIAGNOSIS — T148XXA Other injury of unspecified body region, initial encounter: Secondary | ICD-10-CM | POA: Diagnosis not present

## 2020-08-13 DIAGNOSIS — M79661 Pain in right lower leg: Secondary | ICD-10-CM | POA: Diagnosis not present

## 2020-10-08 DIAGNOSIS — Z23 Encounter for immunization: Secondary | ICD-10-CM | POA: Diagnosis not present

## 2020-12-17 DIAGNOSIS — E782 Mixed hyperlipidemia: Secondary | ICD-10-CM | POA: Diagnosis not present

## 2020-12-17 DIAGNOSIS — I129 Hypertensive chronic kidney disease with stage 1 through stage 4 chronic kidney disease, or unspecified chronic kidney disease: Secondary | ICD-10-CM | POA: Diagnosis not present

## 2020-12-17 DIAGNOSIS — R7301 Impaired fasting glucose: Secondary | ICD-10-CM | POA: Diagnosis not present

## 2021-01-30 DIAGNOSIS — Z23 Encounter for immunization: Secondary | ICD-10-CM | POA: Diagnosis not present

## 2021-03-11 DIAGNOSIS — Z23 Encounter for immunization: Secondary | ICD-10-CM | POA: Diagnosis not present

## 2021-04-23 DIAGNOSIS — Z961 Presence of intraocular lens: Secondary | ICD-10-CM | POA: Diagnosis not present

## 2021-04-23 DIAGNOSIS — H2511 Age-related nuclear cataract, right eye: Secondary | ICD-10-CM | POA: Diagnosis not present

## 2021-05-15 DIAGNOSIS — H2181 Floppy iris syndrome: Secondary | ICD-10-CM | POA: Diagnosis not present

## 2021-05-15 DIAGNOSIS — H25811 Combined forms of age-related cataract, right eye: Secondary | ICD-10-CM | POA: Diagnosis not present

## 2021-06-19 DIAGNOSIS — I129 Hypertensive chronic kidney disease with stage 1 through stage 4 chronic kidney disease, or unspecified chronic kidney disease: Secondary | ICD-10-CM | POA: Diagnosis not present

## 2021-06-19 DIAGNOSIS — E782 Mixed hyperlipidemia: Secondary | ICD-10-CM | POA: Diagnosis not present

## 2021-06-19 DIAGNOSIS — Z125 Encounter for screening for malignant neoplasm of prostate: Secondary | ICD-10-CM | POA: Diagnosis not present

## 2021-06-19 DIAGNOSIS — Z0000 Encounter for general adult medical examination without abnormal findings: Secondary | ICD-10-CM | POA: Diagnosis not present

## 2021-06-19 DIAGNOSIS — R7301 Impaired fasting glucose: Secondary | ICD-10-CM | POA: Diagnosis not present

## 2021-06-19 DIAGNOSIS — N183 Chronic kidney disease, stage 3 unspecified: Secondary | ICD-10-CM | POA: Diagnosis not present

## 2021-06-22 ENCOUNTER — Other Ambulatory Visit: Payer: Self-pay | Admitting: Family Medicine

## 2021-06-22 DIAGNOSIS — E041 Nontoxic single thyroid nodule: Secondary | ICD-10-CM

## 2021-06-29 ENCOUNTER — Ambulatory Visit
Admission: RE | Admit: 2021-06-29 | Discharge: 2021-06-29 | Disposition: A | Discharge status: Home / Self Care | Payer: Medicare Other | Source: Ambulatory Visit | Attending: Family Medicine | Admitting: Family Medicine

## 2021-06-29 DIAGNOSIS — E041 Nontoxic single thyroid nodule: Secondary | ICD-10-CM | POA: Diagnosis not present

## 2022-01-01 DIAGNOSIS — I129 Hypertensive chronic kidney disease with stage 1 through stage 4 chronic kidney disease, or unspecified chronic kidney disease: Secondary | ICD-10-CM | POA: Diagnosis not present

## 2022-05-24 DIAGNOSIS — K080 Exfoliation of teeth due to systemic causes: Secondary | ICD-10-CM | POA: Diagnosis not present

## 2022-06-25 DIAGNOSIS — E782 Mixed hyperlipidemia: Secondary | ICD-10-CM | POA: Diagnosis not present

## 2022-06-25 DIAGNOSIS — Z0000 Encounter for general adult medical examination without abnormal findings: Secondary | ICD-10-CM | POA: Diagnosis not present

## 2022-06-25 DIAGNOSIS — I129 Hypertensive chronic kidney disease with stage 1 through stage 4 chronic kidney disease, or unspecified chronic kidney disease: Secondary | ICD-10-CM | POA: Diagnosis not present

## 2022-06-25 DIAGNOSIS — R7301 Impaired fasting glucose: Secondary | ICD-10-CM | POA: Diagnosis not present

## 2022-06-25 DIAGNOSIS — N183 Chronic kidney disease, stage 3 unspecified: Secondary | ICD-10-CM | POA: Diagnosis not present

## 2022-06-25 DIAGNOSIS — E041 Nontoxic single thyroid nodule: Secondary | ICD-10-CM | POA: Diagnosis not present

## 2022-06-25 DIAGNOSIS — Z125 Encounter for screening for malignant neoplasm of prostate: Secondary | ICD-10-CM | POA: Diagnosis not present

## 2022-07-28 DIAGNOSIS — N183 Chronic kidney disease, stage 3 unspecified: Secondary | ICD-10-CM | POA: Diagnosis not present

## 2022-10-11 DIAGNOSIS — S0101XA Laceration without foreign body of scalp, initial encounter: Secondary | ICD-10-CM | POA: Diagnosis not present

## 2022-10-11 DIAGNOSIS — I129 Hypertensive chronic kidney disease with stage 1 through stage 4 chronic kidney disease, or unspecified chronic kidney disease: Secondary | ICD-10-CM | POA: Diagnosis not present

## 2023-03-10 DIAGNOSIS — K080 Exfoliation of teeth due to systemic causes: Secondary | ICD-10-CM | POA: Diagnosis not present

## 2023-03-15 DIAGNOSIS — K080 Exfoliation of teeth due to systemic causes: Secondary | ICD-10-CM | POA: Diagnosis not present

## 2023-06-28 DIAGNOSIS — E041 Nontoxic single thyroid nodule: Secondary | ICD-10-CM | POA: Diagnosis not present

## 2023-06-28 DIAGNOSIS — N183 Chronic kidney disease, stage 3 unspecified: Secondary | ICD-10-CM | POA: Diagnosis not present

## 2023-06-28 DIAGNOSIS — Z125 Encounter for screening for malignant neoplasm of prostate: Secondary | ICD-10-CM | POA: Diagnosis not present

## 2023-06-28 DIAGNOSIS — Z0000 Encounter for general adult medical examination without abnormal findings: Secondary | ICD-10-CM | POA: Diagnosis not present

## 2023-06-28 DIAGNOSIS — R7301 Impaired fasting glucose: Secondary | ICD-10-CM | POA: Diagnosis not present

## 2023-06-28 DIAGNOSIS — M109 Gout, unspecified: Secondary | ICD-10-CM | POA: Diagnosis not present

## 2023-06-28 DIAGNOSIS — E782 Mixed hyperlipidemia: Secondary | ICD-10-CM | POA: Diagnosis not present

## 2023-06-28 DIAGNOSIS — I129 Hypertensive chronic kidney disease with stage 1 through stage 4 chronic kidney disease, or unspecified chronic kidney disease: Secondary | ICD-10-CM | POA: Diagnosis not present

## 2023-11-08 DIAGNOSIS — K080 Exfoliation of teeth due to systemic causes: Secondary | ICD-10-CM | POA: Diagnosis not present

## 2023-12-29 DIAGNOSIS — I129 Hypertensive chronic kidney disease with stage 1 through stage 4 chronic kidney disease, or unspecified chronic kidney disease: Secondary | ICD-10-CM | POA: Diagnosis not present

## 2023-12-29 DIAGNOSIS — R7301 Impaired fasting glucose: Secondary | ICD-10-CM | POA: Diagnosis not present

## 2024-01-17 DIAGNOSIS — Z23 Encounter for immunization: Secondary | ICD-10-CM | POA: Diagnosis not present

## 2024-01-17 DIAGNOSIS — N179 Acute kidney failure, unspecified: Secondary | ICD-10-CM | POA: Diagnosis not present

## 2024-02-15 DIAGNOSIS — N401 Enlarged prostate with lower urinary tract symptoms: Secondary | ICD-10-CM | POA: Diagnosis not present

## 2024-02-15 DIAGNOSIS — R3914 Feeling of incomplete bladder emptying: Secondary | ICD-10-CM | POA: Diagnosis not present

## 2024-02-15 DIAGNOSIS — N400 Enlarged prostate without lower urinary tract symptoms: Secondary | ICD-10-CM | POA: Diagnosis not present

## 2024-02-15 DIAGNOSIS — R3912 Poor urinary stream: Secondary | ICD-10-CM | POA: Diagnosis not present

## 2024-02-15 DIAGNOSIS — R351 Nocturia: Secondary | ICD-10-CM | POA: Diagnosis not present

## 2024-04-26 ENCOUNTER — Other Ambulatory Visit: Payer: Self-pay | Admitting: Urology

## 2024-06-04 ENCOUNTER — Ambulatory Visit (HOSPITAL_COMMUNITY): Admit: 2024-06-04 | Admitting: Urology

## 2024-06-04 SURGERY — TURBT (TRANSURETHRAL RESECTION OF BLADDER TUMOR)
Anesthesia: General
